# Patient Record
Sex: Male | Born: 1945 | Race: White | Hispanic: No | Marital: Married | Smoking: Current every day smoker
Health system: Southern US, Community
[De-identification: ages and names within clinical notes are randomized; demographics above are authoritative.]

## PROBLEM LIST (undated history)

## (undated) DIAGNOSIS — M1990 Unspecified osteoarthritis, unspecified site: Secondary | ICD-10-CM

## (undated) DIAGNOSIS — I10 Essential (primary) hypertension: Secondary | ICD-10-CM

## (undated) DIAGNOSIS — I2510 Atherosclerotic heart disease of native coronary artery without angina pectoris: Secondary | ICD-10-CM

## (undated) DIAGNOSIS — IMO0002 Reserved for concepts with insufficient information to code with codable children: Secondary | ICD-10-CM

## (undated) DIAGNOSIS — N189 Chronic kidney disease, unspecified: Secondary | ICD-10-CM

## (undated) DIAGNOSIS — G4733 Obstructive sleep apnea (adult) (pediatric): Secondary | ICD-10-CM

## (undated) DIAGNOSIS — F329 Major depressive disorder, single episode, unspecified: Secondary | ICD-10-CM

## (undated) DIAGNOSIS — F32A Depression, unspecified: Secondary | ICD-10-CM

## (undated) DIAGNOSIS — Z720 Tobacco use: Secondary | ICD-10-CM

## (undated) DIAGNOSIS — E669 Obesity, unspecified: Secondary | ICD-10-CM

## (undated) HISTORY — PX: BACK SURGERY: SHX140

## (undated) HISTORY — PX: CORONARY ARTERY BYPASS GRAFT: SHX141

---

## 1998-05-27 ENCOUNTER — Inpatient Hospital Stay (HOSPITAL_COMMUNITY): Admission: EM | Admit: 1998-05-27 | Discharge: 1998-05-28 | Payer: Self-pay | Admitting: *Deleted

## 2000-10-29 ENCOUNTER — Ambulatory Visit: Admission: RE | Admit: 2000-10-29 | Discharge: 2000-10-29 | Payer: Self-pay | Admitting: Internal Medicine

## 2000-10-30 ENCOUNTER — Inpatient Hospital Stay (HOSPITAL_COMMUNITY): Admission: EM | Admit: 2000-10-30 | Discharge: 2000-11-01 | Payer: Self-pay | Admitting: Internal Medicine

## 2000-10-30 ENCOUNTER — Encounter: Payer: Self-pay | Admitting: Emergency Medicine

## 2001-08-31 ENCOUNTER — Emergency Department (HOSPITAL_COMMUNITY): Admission: EM | Admit: 2001-08-31 | Discharge: 2001-08-31 | Payer: Self-pay | Admitting: Emergency Medicine

## 2003-04-23 ENCOUNTER — Ambulatory Visit (HOSPITAL_COMMUNITY): Admission: RE | Admit: 2003-04-23 | Discharge: 2003-04-23 | Payer: Self-pay | Admitting: General Surgery

## 2004-03-07 ENCOUNTER — Ambulatory Visit: Payer: Self-pay | Admitting: Cardiology

## 2004-03-08 ENCOUNTER — Ambulatory Visit: Payer: Self-pay | Admitting: Cardiology

## 2005-01-03 ENCOUNTER — Encounter: Admission: RE | Admit: 2005-01-03 | Discharge: 2005-01-03 | Payer: Self-pay | Admitting: Neurosurgery

## 2005-02-07 ENCOUNTER — Encounter: Admission: RE | Admit: 2005-02-07 | Discharge: 2005-02-07 | Payer: Self-pay | Admitting: Neurosurgery

## 2005-03-26 ENCOUNTER — Encounter: Admission: RE | Admit: 2005-03-26 | Discharge: 2005-03-26 | Payer: Self-pay | Admitting: Neurosurgery

## 2005-04-06 ENCOUNTER — Ambulatory Visit: Payer: Self-pay | Admitting: Cardiology

## 2005-04-12 ENCOUNTER — Ambulatory Visit (HOSPITAL_COMMUNITY): Admission: RE | Admit: 2005-04-12 | Discharge: 2005-04-13 | Payer: Self-pay | Admitting: Neurosurgery

## 2006-06-10 ENCOUNTER — Ambulatory Visit: Payer: Self-pay | Admitting: Cardiology

## 2006-07-08 ENCOUNTER — Ambulatory Visit (HOSPITAL_COMMUNITY): Admission: RE | Admit: 2006-07-08 | Discharge: 2006-07-08 | Payer: Self-pay | Admitting: Internal Medicine

## 2007-01-20 ENCOUNTER — Inpatient Hospital Stay (HOSPITAL_COMMUNITY): Admission: RE | Admit: 2007-01-20 | Discharge: 2007-01-21 | Payer: Self-pay | Admitting: Neurosurgery

## 2007-05-22 ENCOUNTER — Ambulatory Visit: Payer: Self-pay | Admitting: Cardiology

## 2007-06-10 ENCOUNTER — Ambulatory Visit: Payer: Self-pay | Admitting: Cardiology

## 2007-06-19 ENCOUNTER — Ambulatory Visit: Payer: Self-pay | Admitting: Cardiology

## 2007-06-23 ENCOUNTER — Inpatient Hospital Stay (HOSPITAL_BASED_OUTPATIENT_CLINIC_OR_DEPARTMENT_OTHER): Admission: RE | Admit: 2007-06-23 | Discharge: 2007-06-23 | Payer: Self-pay | Admitting: Cardiology

## 2007-06-23 ENCOUNTER — Ambulatory Visit: Payer: Self-pay | Admitting: Cardiovascular Disease

## 2007-07-04 ENCOUNTER — Ambulatory Visit: Payer: Self-pay | Admitting: Cardiology

## 2007-08-05 ENCOUNTER — Emergency Department (HOSPITAL_COMMUNITY): Admission: EM | Admit: 2007-08-05 | Discharge: 2007-08-06 | Payer: Self-pay | Admitting: Emergency Medicine

## 2007-09-04 ENCOUNTER — Ambulatory Visit (HOSPITAL_COMMUNITY): Admission: RE | Admit: 2007-09-04 | Discharge: 2007-09-05 | Payer: Self-pay | Admitting: Neurosurgery

## 2008-12-02 ENCOUNTER — Encounter (INDEPENDENT_AMBULATORY_CARE_PROVIDER_SITE_OTHER): Payer: Self-pay | Admitting: *Deleted

## 2008-12-02 DIAGNOSIS — I25810 Atherosclerosis of coronary artery bypass graft(s) without angina pectoris: Secondary | ICD-10-CM

## 2008-12-02 DIAGNOSIS — E785 Hyperlipidemia, unspecified: Secondary | ICD-10-CM

## 2008-12-02 DIAGNOSIS — I10 Essential (primary) hypertension: Secondary | ICD-10-CM | POA: Insufficient documentation

## 2009-04-28 ENCOUNTER — Ambulatory Visit (HOSPITAL_COMMUNITY): Admission: RE | Admit: 2009-04-28 | Discharge: 2009-04-28 | Payer: Self-pay | Admitting: Internal Medicine

## 2009-09-02 ENCOUNTER — Ambulatory Visit (HOSPITAL_COMMUNITY): Admission: RE | Admit: 2009-09-02 | Discharge: 2009-09-02 | Payer: Self-pay | Admitting: Urology

## 2009-09-30 ENCOUNTER — Ambulatory Visit (HOSPITAL_COMMUNITY): Admission: RE | Admit: 2009-09-30 | Discharge: 2009-09-30 | Payer: Self-pay | Admitting: Urology

## 2009-10-27 ENCOUNTER — Ambulatory Visit (HOSPITAL_COMMUNITY): Admission: RE | Admit: 2009-10-27 | Discharge: 2009-10-27 | Payer: Self-pay | Admitting: General Surgery

## 2009-10-28 ENCOUNTER — Encounter (INDEPENDENT_AMBULATORY_CARE_PROVIDER_SITE_OTHER): Payer: Self-pay | Admitting: General Surgery

## 2009-10-28 ENCOUNTER — Inpatient Hospital Stay (HOSPITAL_COMMUNITY): Admission: RE | Admit: 2009-10-28 | Discharge: 2009-10-31 | Payer: Self-pay | Admitting: General Surgery

## 2009-11-03 ENCOUNTER — Encounter (INDEPENDENT_AMBULATORY_CARE_PROVIDER_SITE_OTHER): Payer: Self-pay | Admitting: *Deleted

## 2010-04-30 ENCOUNTER — Encounter: Payer: Self-pay | Admitting: Urology

## 2010-05-09 NOTE — Letter (Signed)
Summary: Unable to Reach, Consult Scheduled  [facility]  [address]   [place], [place] [address]   Phone: [phone]  Fax: [phone]    11/03/2009  [facility] [address] [place], [place]  [address] 1945-11-04   Dear Dean Estes,   We have been unable to reach you by phone.  Please contact our office with an updated phone number.  At the recommendation of DR [facility] we have been asked to schedule you a consult with DR Jena Gauss for RECTAL BLEEDING/CONSULT FOR COLONOSCOPY.   Please call our office at [phone].     Thank you,    Diana Eves  [facility] R. Roetta Sessions, M.D.    Jonette Eva, M.D. Lorenza Burton, FNP-BC    Tana Coast, PA-C Phone: [phone]    Fax: [phone]

## 2010-05-09 NOTE — Letter (Signed)
Summary: Referral, Unable to Schedule  [facility]  [address]   [place], [place] [address]   Phone: [phone]  Fax: [phone]    November 03, 2009                                   REAHMED INNISS                                         [address]                                         [place], [place]  [address]                                         Date Of Birth: 02/21/1946  Dear Dr.__:   Danae Chen you for your kind referral of the above patient. We have attempted to schedule the recommended procedure __ but have been unable to schedule because:  __ The patient was not available by phone and/or has not returned our calls.  __ The patient declined to schedule the procedure at this time.  We appreciate the referral and hope that we will have the opportunity to treat this patient in the future.    Sincerely,     Diana Eves  [facility] R. Roetta Sessions, M.D.    Jonette Eva, M.D. Lorenza Burton, FNP-BC    Tana Coast, PA-C Phone: [phone]    Fax: [phone]

## 2010-06-24 LAB — CROSSMATCH: Antibody Screen: NEGATIVE

## 2010-06-24 LAB — CBC
Hemoglobin: 12.8 g/dL — ABNORMAL LOW (ref 13.0–17.0)
MCH: 33.6 pg (ref 26.0–34.0)
MCH: 33.7 pg (ref 26.0–34.0)
MCHC: 34.3 g/dL (ref 30.0–36.0)
MCHC: 34.3 g/dL (ref 30.0–36.0)
Platelets: 226 10*3/uL (ref 150–400)
Platelets: 236 10*3/uL (ref 150–400)
RBC: 3.81 MIL/uL — ABNORMAL LOW (ref 4.22–5.81)
RDW: 13.5 % (ref 11.5–15.5)
WBC: 11.9 10*3/uL — ABNORMAL HIGH (ref 4.0–10.5)
WBC: 9.3 10*3/uL (ref 4.0–10.5)

## 2010-06-24 LAB — BASIC METABOLIC PANEL
BUN: 25 mg/dL — ABNORMAL HIGH (ref 6–23)
CO2: 25 mEq/L (ref 19–32)
Calcium: 10.1 mg/dL (ref 8.4–10.5)
Calcium: 8.8 mg/dL (ref 8.4–10.5)
Chloride: 106 mEq/L (ref 96–112)
Chloride: 106 mEq/L (ref 96–112)
Creatinine, Ser: 1.85 mg/dL — ABNORMAL HIGH (ref 0.4–1.5)
GFR calc non Af Amer: 32 mL/min — ABNORMAL LOW (ref >60)
Potassium: 3.7 mEq/L (ref 3.5–5.1)

## 2010-06-24 LAB — DIFFERENTIAL
Basophils Absolute: 0 10*3/uL (ref 0.0–0.1)
Eosinophils Relative: 5 % (ref 0–5)
Lymphocytes Relative: 18 % (ref 12–46)
Monocytes Absolute: 0.6 10*3/uL (ref 0.1–1.0)
Monocytes Relative: 7 % (ref 3–12)
Neutro Abs: 6.5 10*3/uL (ref 1.7–7.7)
Neutrophils Relative %: 70 % (ref 43–77)

## 2010-06-24 LAB — MAGNESIUM: Magnesium: 1.8 mg/dL (ref 1.5–2.5)

## 2010-06-25 LAB — BASIC METABOLIC PANEL
Calcium: 9.9 mg/dL (ref 8.4–10.5)
GFR calc Af Amer: 51 mL/min — ABNORMAL LOW (ref >60)
Glucose, Bld: 110 mg/dL — ABNORMAL HIGH (ref 70–99)
Potassium: 4 mEq/L (ref 3.5–5.1)

## 2010-06-25 LAB — CBC
HCT: 46.6 % (ref 39.0–52.0)
MCV: 97.9 fL (ref 78.0–100.0)

## 2010-08-22 NOTE — Assessment & Plan Note (Signed)
[facility]                          Dean Estes   NAME:Dean Estes, Dean Estes                   MRN:          [id number]  DATE:05/22/2007                            DOB:          10-Nov-1945    REASON FOR VISIT:  Scheduled annual followup.   Dean Estes returns to our clinic for continued management of  multivessel coronary artery disease, after last seen by Dean Estes in April  2008.  He is closely followed by Dr. Carylon Perches, in [place].  Since  his last visit, he has been placed on low-dose amlodipine for  hypertension and Welchol for hyperlipidemia.  He has documented  intolerance to numerous statins, as well as Zetia and Tricor.  When I  last saw him I suggested Crestor, but he informs me today that he seems  to recall that this too resulted in significant adverse effects.  In  fact, he tells me that he developed a rash on Crestor.  Thus far, he  appears to be tolerating the Welchol, but is not able to take the full  dose of 6 tablets.   From a clinical standpoint, the patient reports no interim development  of unstable angina pectoris.  In fact, he apparently underwent lower  back surgery in October 2008, at [facility], with no noted  complications.  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  He is currently awaiting clearance by [facility] to do so.   The patient did take my prescription for Chantix at the time of his last  visit.  He tells me that he did stop smoking for about 6 months, but has  since resumed, albeit at the low level of one pack a week.   CURRENT MEDICATIONS:  1. Cozaar 50 daily.  2. Prilosec.  3. Coated aspirin 81 daily.  4. Fish oil 3000 mg daily.  5. Welchol 625 mg daily.  6. Fluoxetine 20 mg daily.  7. Glucosamine.  8. Amlodipine 5 mg daily.  9. Metoprolol 25 mg b.i.d.  10.Lyrica 75 mg daily.  11.CPAP.   PHYSICAL EXAMINATION:  VITAL SIGNS:  Blood pressure 150/88, pulse 60  and  regular, weight 259 (up 7 pounds).  GENERAL:  A 65 year old male, morbidly obese, sitting upright in no  distress.  HEENT:  Normocephalic, atraumatic.  NECK:  Palpable bilateral pulses without bruits.  Unable to assess JVD  secondary to neck girth.  LUNGS:  Clear to auscultation in all fields.  HEART:  Regular rate and rhythm (S1, S2).  No significant murmurs.  No  rubs.  ABDOMEN:  Protuberant, nontender with intact bowel sounds.  EXTREMITIES:  Palpable distal pulses with no significant pedal edema.  NEUROLOGIC:  No focal deficits.   IMPRESSION:  1. Multivessel coronary artery disease.      a.     Status post coronary artery bypass graft in 1998:  patent       bypass grafts/normal left ventricular function by cardiac       catheterization in 2000.      b.     Nonischemic adequate exercise  stress Cardiolite; ejection       fraction 62%, November 2005.  2. Dyslipidemia.      a.     Intolerant to all statins, as well as Zetia and Tricor.  3. Hypertension.  4. Obstructive sleep apnea, on CPAP.  5. Tobacco.  6. Morbid obesity.   PLAN:  1. Schedule adenosine stress Cardiolite for risk stratification.      According to [facility] guidelines, the patient is due for a surveillance      study given that he is now 1/2 years out from his last study.  2. Continue current medication regimen, as per Dr. Ouida Sills.  Close      monitoring of lipid profile with target LDL goal of 70 or less, if      feasible.  3. The patient once again strongly advised to stop smoking tobacco.  4. 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  If      his stress test is suggestive of ischemia, then we will have him      return to the clinic for early followup and consideration of      possible cardiac catheterization.  If, however, his study is      negative, then he can return to our clinic to resume followup with      Dr. Andee Lineman in 1 year.      Rozell Searing, PA-C  Electronically  Signed      Jonelle Sidle, MD  Electronically Signed   GS/MedQ  DD: 05/22/2007  DT: 05/24/2007  Job #: [id number]   cc:   Kingsley Callander. Ouida Sills, MD

## 2010-08-22 NOTE — Op Note (Signed)
NAME:  Dean Estes, Dean Estes NO.:  [id number]   MEDICAL RECORD NO.:  [id number]          PATIENT TYPE:  INP   LOCATION:  3524                         FACILITY:  [facility]   PHYSICIAN:  Clydene Fake, M.D.  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   PROCEDURE:  Anterior cervical decompression and diskectomy and fusion C3-  C4 with LifeNet allograft bone and Trestle anterior cervical plate.   SURGEON:  Clydene Fake, MD.   ASSISTANT:  Hewitt Shorts, MD.   General endotracheal tube anesthesia.   ESTIMATED BLOOD LOSS:  Minimal.   BLOOD GIVEN:  None.   DRAINS:  None.   COMPLICATIONS:  None.   REASON FOR PROCEDURE:  The patient is a 65 year old gentleman who has  had neck pain, arm and leg tingling and clumsiness that has been  progressive in MRI.  Cervical spine shows cord compression with cord  change seen at C3-C4 level.  The patient was brought in for an anterior  cervical decompression and fusion.  The patient is myelopathic on exam.   PROCEDURE IN DETAIL:  The patient was brought to the operating room and  general anesthesia was induced after a GlideScope intubation and being  careful not to extend his neck.  The patient was then prepped and draped  in a sterile fashion, placed in 10 pounds halter traction.  Site of  incision was injected with 10 mL of 1% Lidocaine with epinephrine.  An  incision was then made from the midline to the anterior border of the  sternocleidomastoid muscle.  On the left side, neck incision taken down  to the platysma and hemostasis obtained with Bovie cauterization.  The  platysma was incised with the Bovie and blunt dissection taken  through  the anterior cervical fascia to the anterior cervical spine.  Needle was  placed in the interspace.  X-rays were obtained showing this at the C3-  C4 interspace.  Disk space was incised and partial diskectomy performed  as the needle was removed.  Longus coli muscle was reflected laterally  on each side using the Bovie, and self-retaining retractor was placed.  Diskectomy continued with pituitary rongeurs and curettes.  Anterior  osteophytes removed with Kerrison punches.  Distraction pins were placed  in the C3 and C4.  The interspace was distracted.  Microscope was  brought in for microdissection.  Diskectomy continued with pituitary  rongeurs and curettes and 1 and 2-mm Kerrison punches were used to  remove posterior disk osteophytes and ligament decompressing the central  canal and bilateral foraminotomies were performed.  After a good  decompression, high-speed drill was used to remove cartilaginous end  plates.  We measured height of disk space to be 6 mm, and a 6-mm LifeNet  allograft bone was tapped into place, countersunk couple of millimeters.  We checked posterior to the graft  with a nerve hook and there was plenty  of room between the bone graft and dura.  Distraction pins were removed.  Hemostasis obtained with Gelfoam and thrombin.  Weight was removed from  the traction.  The bone was firmly in place.  A Trestle anterior  cervical plate was placed over the anterior cervical spine and 2 screws  were placed in the C3 and C4.  These were tightened down.  Lateral x-  rays were obtained showing good position of the plate and screws and  bone plug at the C3-C4 level.  The retractor was removed.  Hemostasis  was obtained with bipolar cauterization.  We irrigated with antibiotic  solution.  We had good hemostasis and the platysma was closed with 3-0  Vicryl interrupted sutures.  Subcutaneous tissue closed with same.  Skin  closed with benzoin and Steri-Strips.  Dressing was  placed.  The patient  was placed into a soft cervical collar, awakened from anesthesia, and  transferred to recovery room in stable condition.           ______________________________  Clydene Fake, M.D.     JRH/MEDQ  D:  09/04/2007  T:  09/05/2007  Job:  [id number]

## 2010-08-22 NOTE — Assessment & Plan Note (Signed)
[facility]                          [facility] OFFICE NOTE   NAME:Dean Estes, Dean Estes                   MRN:          [id number]  DATE:06/19/2007                            DOB:          07-Dec-1945    REFERRING PHYSICIAN:  Kingsley Callander. Ouida Sills, MD.   CARDIOLOGIST:  Jonelle Sidle, MD   HISTORY OF PRESENT ILLNESS:  The patient is a 65 year old male with a  history of multivessel coronary artery disease last seen on May 21, 2006.  The patient is status post coronary artery bypass graft September  1998 and a patent bypass graft and catheterization in 2000.  He had a  nonischemic Cardiolite in November of 2005.  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  He did  have a stress test scheduled which demonstrated however ischemia.  This  was an adenosine infusion protocol and the ejection fraction was 49%.  The patient has evidence of mild global hypokinesis with a mid and  complete reversal mid to baseline interseptal defect associated with  mild hypokinesis.  There was also a small fixed basolateral defect  associated with mild hypokinesis.  The latter defect appeared to be  consistent with a prior myocardial infarction.  The patient now returns  for followup and to discuss his study results.  His EKG in the office  today demonstrates normal sinus rhythm with no acute ischemic changes.  He denies any chest pain although he states that he does get short of  breath upon exertion.  He has had difficulty walking and difficulty  getting back to an exercise program due to prior back surgery with  weakness in the lower extremities.  He denies however any substernal  chest pain.  He has gained a significant amount of weight although he  has lost 10 pounds in the last 2 months.  Of note is that the patient is  intolerant to multiple statins which has been associated with myopathy  and muscle burning.  Also  Welchol has caused him to have painful  nipples.  The patient currently only takes aspirin with phytosterol.   MEDICATIONS:  1. Omega 3 fish oil.  2. Omeprazole 20 mg p.o. daily.  3. Welchol was discontinued.  4. Garlic 1000 mg p.o. daily.  5. Aspirin 81 mg a day.  6. Cozaar 50 mg p.o. daily.  7. Metoprolol 50 mg one half tablet p.o. b.i.d.  8. Amlodipine 5 mg p.o. daily.  9. Multivitamin daily.  10.Fluoxetine 20 mg p.o. daily.   PHYSICAL EXAMINATION:  VITAL SIGNS:  Blood pressure is 170/94, heart  rate is 72, weight is 254 pounds.  NECK:  Exam normal.  No carotid bruits.  LUNGS:  Clear breath sounds bilaterally.  HEART:  Regular rate and rhythm.  Normal S1, S2.  No murmurs, rubs or  gallops.  ABDOMEN:  Soft, nontender, but distant.  EXTREMITIES:  No cyanosis, clubbing or edema.  NEUROLOGICAL:  The patient is alert and grossly nonfocal.   A 12-lead electrocardiogram normal sinus rhythm with nonspecific ST-T  wave changes.  DIAGNOSES:  1. Multivessel coronary artery disease.      a.     Status post coronary artery bypass graft in 1998 with patent       bypass grafts and normal LV function.  Catheterization in 2000.      b.     Nonischemic Cardiolite in November of 2005.      c.     Abnormal Cardiolite on June 09, 2007, with ejection fraction       49% and defects as outlined above.  2. Dyslipidemia.      a.     Intolerant to all statins as well as Zetia and Tricor and       now also intolerant to [employer].  3. Hypertension, poorly controlled.  4. Obstructive sleep apnea.  5. Tobacco use.  6. Morbid obesity.   PLAN:  1. The patient will proceed with diagnostic catheterization due to the      fact that he has significant abnormal Cardiolite study.  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      It could be that this is a false positive study but I told the      patient  that the best possible option is to proceed with      catheterization to further evaluate this.  2. The patient was advised about weight loss and to try to start an      exercise program.  3. The patient's blood pressure is poorly controlled and we will go      ahead and increase his Cozaar to 100 mg p.o. daily but he will need      the addition of hydrochlorothiazide 25 mg p.o. daily.     Learta Codding, MD,FACC  Electronically Signed    GED/MedQ  DD: 06/19/2007  DT: 06/20/2007  Job #: [id number]   cc:   Kingsley Callander. Ouida Sills, MD  Jonelle Sidle, MD

## 2010-08-22 NOTE — Op Note (Signed)
NAME:  Dean Estes, Dean Estes NO.:  [id number]   MEDICAL RECORD NO.:  [id number]          PATIENT TYPE:  INP   LOCATION:  3172                         FACILITY:  [facility]   PHYSICIAN:  Clydene Fake, M.D.  DATE OF BIRTH:  08-10-1945   DATE OF PROCEDURE:  01/20/2007  DATE OF DISCHARGE:                               OPERATIVE REPORT   PREOPERATIVE DIAGNOSIS:  Lumbar stenosis.   POSTOPERATIVE DIAGNOSIS:  Lumbar stenosis.   PROCEDURE:  Redo decompressive laminectomy, decompressing the L2, L3, L4  and L5 roots (four levels).   REASON FOR PROCEDURE:  The patient is a 65 year old gentleman who had X  Stop placement at L2-3 and L3-4 for stenosis almost 2 years ago.  He had  great relief for awhile and then fell at work.  He has been having  severe back and leg pain, burning of his feet.  Conservative management  and antiinflammatories have not helped.  Epidural injection only had  brief response.  The patient is brought for redo decompressive  laminectomy.   PROCEDURE IN DETAIL:  The patient was brought to the operating room.  Anesthesia was induced.  The patient was placed in the prone position on  the Wilson frame.  All pressure points were padded.  Patient was prepped  and draped in sterile fashion.  The site of the incision was injected  with 20 mL of 1% lidocaine with epinephrine.  An incision was then made  at the site of the previous scar and this was extended slightly both  caudally and cephalad.  The incision was taken down to fascia.  Hemostasis was taken with Bovie cauterization.  The fascia was incised  with a Bovie on both sides and subperiosteal dissection was done across  the lamina out to the facets.  The X Stop devices at L2-3 and L3-4 were  seen.  These were also dissected out and the lamina self-retaining  retractors were placed.  A marker was placed at the L4-5 interspace and  the L2-3 interspace.  Then an x-ray was obtained confirming our  positioning.   Decompressive laminectomy was then performed removing the  spinous process in the bottom half of 2, 3, 4 and the top half of 5.  Doing this loosened the X Stop devices and both of those were removed.  We continued with our decompressive laminectomy, decompressing the  lamina with the Leksell rongeurs and then used a high speed drill to  continue the laminectomy to then remove cortex.  Kerrison punches were  used to remove ligaments and the rest of the bone, decompressing the  central canal.  Bilateral gutters were decompressed with both high speed  drill and Kerrison punches. The __________  ligament at the lateral  gutters were removed with curets and Kerrison punches.  When we were  finished, we had good decompression of the central thecal sac and good  lateral decompression.  We explored each of the nerve roots in the L2,  L3, L4 and L5 roots bilaterally.  They were well decompressed.  We could  follow their course out the foramen with a dilator.  Hemostasis was  obtained with Gelfoam and thrombin and bipolar cauterization.  Gelfoam  was irrigated out.  We had good hemostasis.  Retractors were removed.  The fascia was closed with 0 Vicryl interrupted sutures, subcutaneous  tissue was closed with 0, 2-0 and 3-0 Vicryl interrupted suture, skin  closed with benzoin and Steri-Strips.  Dressing was placed.  Patient was  placed back into supine position, woken from anesthesia and transferred  to the recovery room in stable condition.           ______________________________  Clydene Fake, M.D.    JRH/MEDQ  D:  01/20/2007  T:  01/20/2007  Job:  [id number]

## 2010-08-22 NOTE — Cardiovascular Report (Signed)
NAME:  Dean Estes, Dean Estes NO.:  [id number]   MEDICAL RECORD NO.:  [id number]          PATIENT TYPE:  OIB   LOCATION:  1961                         FACILITY:  [facility]   PHYSICIAN:  Veverly Fells. Excell Seltzer, MD  DATE OF BIRTH:  25-Jan-1946   DATE OF PROCEDURE:  06/23/2007  DATE OF DISCHARGE:                            CARDIAC CATHETERIZATION   PROCEDURES:  1. Left heart catheterization.  2. Selective coronary angiography.  3. Left ventricular angiography.  4. Saphenous vein graft angiography.  5. Left internal mammary artery angiography.   INDICATIONS:  Dean Estes is a 65 year old gentleman with known  coronary artery disease.  He has had multivessel bypass surgery and as  part of his DOT physical he underwent an adenosine Myoview scan that was  abnormal, showing mid to basal anteroseptal ischemia.  He is completely  asymptomatic.  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   Risks and indications of the procedure were reviewed with the patient.  Informed consent was obtained.  The right groin was prepped, draped and  anesthetized with 1% lidocaine.  Using the modified Seldinger technique,  a 4-French sheath was placed in the right femoral artery.  Standard 6-  French Judkins catheters were used for native vessel angiography.  For  vein graft angiography an RCB catheter was used for the right coronary  bypass and a 3-DRC catheter was used for the OM bypass.  I had a very  difficult time engaging the left internal mammary artery as it had an  anterior origin from lower than normal part of the subclavian artery.  Multiple catheters were used.  Ultimately the best image was obtained  with a multipurpose catheter.  After coronary and vein graft  angiography, a 4-French angled pigtail catheter was used to perform left  ventriculography.  The patient tolerated the procedure well and had no  immediate  complications.   FINDINGS:  Aortic pressure 150/89 with mean of 115, left ventricular  pressure 151/30.   CORONARY ANGIOGRAPHY:  The left mainstem is diffusely diseased without  significant stenosis.  The plaque is nonobstructive.  The left mainstem  supplies a left circumflex branch, which is completely occluded in its  proximal portion.  The LAD is occluded at the ostium.   The right coronary artery is occluded in its proximal portion and  supplies a small conus branch.   SAPHENOUS VEIN GRAFT ANGIOGRAPHY:  There is a sequence graft to an acute  marginal branch of the right coronary artery as well as the distal right  coronary artery.  The saphenous vein graft is patent but is diffusely  diseased, especially in the proximal portion of the graft.  There are no  significant stenoses present.  The acute marginal branch is small but is  patent.  The distal right coronary artery the near the PDA but beyond  the graft insertion has an 80% stenosis.  The vessel was diffusely  diseased.  There is severe angulation between the graft to coronary  anastomosis and the lesion.   A saphenous vein graft sequenced to  the ramus intermedius and OM  branches of the left circumflex is widely patent throughout.  The OM  branch is patent and is a large vessel.  The ramus is patent and it is a  small vessel.   The left internal mammary to LAD is only marginally visualized but it is  clearly widely patent.  The diagonal and LAD branches are both widely  patent.   Left ventriculography demonstrates normal LV function with an LVEF of  60%.   ASSESSMENT:  1. Severe native three-vessel coronary artery disease.  2. Patent left internal mammary sequenced to left anterior descending      artery and first diagonal.  3. Is patent saphenous vein graft sequenced to ramus intermedius and      an obtuse marginal branch of the left circumflex.  4. Patent saphenous vein graft sequenced to acute marginal branch  and      posterior descending artery branch of the right coronary artery.   DISCUSSION:  Dean Estes has widely patent bypass grafts.  The only  lesion that appears focal and significant is in the PDA branch of the  right coronary artery.  However, it involves a severe angulation of  nearly 270 degrees and I do not think it is possible to deliver a stent  to that area.  It in a patient who is completely asymptomatic I would  clearly favor medical therapy.  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  I have discussed this case  with Dr. Andee Lineman, and I will set him up for an exercise treadmill test  and Dr. Andee Lineman will follow up with him in the office after his exercise  treadmill is complete.      Veverly Fells. Excell Seltzer, MD  Electronically Signed     MDC/MEDQ  D:  06/23/2007  T:  06/23/2007  Job:  [id number]   cc:   Learta Codding, MD,FACC

## 2010-08-25 NOTE — Assessment & Plan Note (Signed)
[facility]                          [facility]ICE NOTE   NAME:[name], Dean Estes                   MRN:          [id number]  DATE:06/10/2006                            DOB:          May 18, 1945    NOTE:  Change the primary cardiologist on the previous note (June 10, 2006) from Dr. Simona Huh to Dr. Michelle Piper Degent.   Change the follow-up to myself and Dr. Lewayne Bunting, not Dr. Simona Huh.     Gene Serpe, PA-C  Electronically Signed    GS/MedQ  DD: 06/10/2006  DT: 06/10/2006  Job #: [id number]

## 2010-08-25 NOTE — Op Note (Signed)
NAME:  Dean Estes, Dean Estes NO.:  [id number]   MEDICAL RECORD NO.:  [id number]          PATIENT TYPE:  AMB   LOCATION:  SDS                          FACILITY:  [facility]   PHYSICIAN:  Clydene Fake, M.D.  DATE OF BIRTH:  April 29, 1945   DATE OF PROCEDURE:  04/12/2005  DATE OF DISCHARGE:                                 OPERATIVE REPORT   PREOPERATIVE DIAGNOSIS:  Lumbar stenosis, spondylosis.   POSTOPERATIVE DIAGNOSIS:  Lumbar stenosis, spondylosis.   OPERATION PERFORMED:  L2-3 and L3-4 X-Stop placement, intraoperative  fluoroscopy.   SURGEON:  Clydene Fake, M.D.   ANESTHESIA:  General endotracheal.   ESTIMATED BLOOD LOSS:  Minimal.   BLOOD REPLACED:  None.   DRAINS:  None.   COMPLICATIONS:  None.   INDICATIONS FOR PROCEDURE:  The patient is a 65 year old gentleman who has  been having bilateral leg pain, worse when he walks. He can only go a short  distance before he has to stop and rest.  Epidural injections have helped  him in the past but only for a brief period of time ____________ helps his  symptoms.  MRI was done showing multiple spondylitic changes but severe  stenosis, L2-3 and 3-4, moderate stenosis at 4-5.  He is brought in for X-  Stop device at 2-3 and 3-4 to decrease his neurogenic claudication.   DESCRIPTION OF PROCEDURE:  The patient was brought to the operating room and  general anesthesia was induced.  The patient was placed in prone position on  the Wilson frame and all pressure points padded.  The patient was prepped  and draped in sterile fashion.  The site of incision was injected with 15 mL  of 1% lidocaine with epinephrine.  A needle was placed in the interspace.  Fluoroscopy was used and this showed that we were pointing at the L3 spinous  process.  An incision was made centered where the needle was.  Incision  taken down to the fascia.  Hemostasis obtained with Bovie cauterization.  The fascia was incised on each side of the spinous  processes of 2, 3, and 4  and subperiosteal dissection was done over the L2, 3, and 4 spinous process  and lamina to the facets.  We again confirmed our positioning at the 2-3, 3-  4 paraspinous levels with fluoroscopy and then at the 2-3 used two different  dilators to dilate the interspinous ligaments.  We used a final dilator and  we dilated up just past 12 mm.  A 12 mm X-Stop device was used and placed  into the space we made in the interspinous ligament anteriorly. We brought  this to the other side.  The locking wing was then placed and the screw  engaged.  We tightened down the screw and removed the applicators.  We  repeated this process at L3-4, dilating up to just past 12 mm, placed a 12  mm X-Stop device in the interspinous ligament, placed the locking wing and  tightened it down and then removed the applicators.  Final AP and lateral  fluoroscopic imaging showed good positioning of the X-Stop  device at the L2-  3 and 3-4 levels.  Hemostasis was obtained with Bovie cauterization. Wound  was irrigated with antibiotic solution. The fascia was closed with 0 Vicryl  interrupted suture.  The subcutaneous tissue closed with 0, 2-0 and 3-0  Vicryl,  interrupted suture and the skin closed with benzoin and Steri-Strips.  Dry  sterile dressing was applied.  The patient was placed back into supine  position, awakened from anesthesia and transferred to the recovery room in  stable condition.           ______________________________  Clydene Fake, M.D.     JRH/MEDQ  D:  04/12/2005  T:  04/13/2005  Job:  [id number]

## 2010-08-25 NOTE — H&P (Signed)
NAME:  Dean Estes, Dean Estes NO.:  [id number]   MEDICAL RECORD NO.:  [id number]                  PATIENT TYPE:   LOCATION:                                       FACILITY:   PHYSICIAN:  Dalia Heading, M.D.               DATE OF BIRTH:  1946-01-28   DATE OF ADMISSION:  DATE OF DISCHARGE:                                HISTORY & PHYSICAL   CHIEF COMPLAINT:  GERD.   HISTORY OF PRESENT ILLNESS:  The patient is a 65 year old white male who is  referred for evaluation and treatment of GERD.  He has been on Prilosec for  some time; and has never had an EGD  He does well on Prilosec.  He denies  any nausea, vomiting, or epigastric pain.  He does have GERD when off the  medication.   PAST MEDICAL HISTORY:  Includes hypertension.   PAST SURGICAL HISTORY:  Hand surgery, flexible sigmoidoscopy by Dr. Ouida Sills 2  years ago.   CURRENT MEDICATIONS:  Cozaar, Prilosec, metoprolol, Lotensin, Prozac, baby  aspirin.   ALLERGIES:  SULFA.   REVIEW OF SYSTEMS:  The patient does smoke a pack of cigarettes a day,  denies any alcohol use.  Denies any other cardiopulmonary difficulties or  bleeding disorders.   PHYSICAL EXAMINATION:  GENERAL:  On physical examination, the patient is a  well-developed well-nourished white male in no acute distress.  VITAL SIGNS:  He is afebrile and vital signs are stable.  LUNGS:  Clear to auscultation with equal breath sounds bilaterally.  HEART:  Heart examination reveals a regular rate and rhythm without S3, S4,  or murmurs.  ABDOMEN:  The abdomen is soft, nontender, nondistended.  No  hepatosplenomegaly or masses are noted.   IMPRESSION:  Gastroesophageal reflux disease.   PLAN:  The patient is scheduled for an EGD on April 23, 2003.  The risks  and benefits of the procedure including bleeding and perforation were fully  explained to the patient, who gave informed consent.     ___________________________________________                                Dalia Heading, M.D.   MAJ/MEDQ  D:  04/22/2003  T:  04/22/2003  Job:  [id number]   cc:   Kingsley Callander. Ouida Sills, M.D.  [address]  [place]  [place] [address]  Fax: [phone]

## 2010-08-25 NOTE — Assessment & Plan Note (Signed)
[facility]                          [facility] OFFICE NOTE   NAME:Dean Estes, Dean Estes                   MRN:          [id number]  DATE:06/10/2006                            DOB:          [birth year]    PRIMARY CARDIOLOGIST:  Dr. Lewayne Bunting.   REASON FOR VISIT:  Scheduled annual followup.   Dean Estes is a very pleasant 65 year old male with known coronary  artery disease, last seen here in the clinic by Arnette Felts, PA-C, in  February 2007, and previously seen by Dr. Simona Huh in December 2006.   Patient's cardiac history notable for 6 vessel CABG in July 1998 and  with most recent catheterization in 2000 reveling widely patent bypass  graft and normal left ventricular function.   Patient's last stress test was an exercise stress Cardiolite in November  2005, reveling no definite evidence of ischemia; calculated ejection  fraction 62%.   Patient reports no interim development of any signs of symptoms  suggestive of unstable angina pectoris; he denies any chest pain, and in  fact, states that he can climb a flight of stairs with no chest  discomfort. He also reports no significant exertional dyspnea.   OF NOTE PATIENT HAS A LONGSTANDING HISTORY OF INTOLERANCE TO CERTAIN  STATINS. ALTHOUGH HE HAD BEEN ON BOTH ZOCOR AND LIPITOR FOR ABOUT 3  YEARS, HE SUBSEQUENTLY DEVELOPED SIGNIFICANT LEG AND BACK MYALGIA WHICH  RESOLVED PROMPTLY ONLY A FEW DAYS AFTER STOPPING BOTH OF THESE MEDS.  MOST RECENTLY, HE WAS TRIED ON ZETIA AND WAS INTOLERANT TO THIS  MEDICATION IN A RELATIVELY SHORT PERIOD OF TIME. HE HAS ALSO BEEN TRIED  ON TRICOR, AGAIN WITH ASSOCIATED SIGNIFICANT SIDE EFFECTS.   Patient continues to smoke, although he states today that he is cutting  down. He has also been diagnosed with obstructive sleep apnea in 1998  and tells me today that he has been very compliant with his CPAP  treatment.   Patient does report recent apparent sinus  infection with associated  headache and a greenish-tinged sinus drainage. He has had this in the  past and was previously effectively treated with amoxicillin.   Electrocardiogram today reveals NSR at 75 BPM with normal axis and  chronic lateral ST changes.   CURRENT MEDICATIONS:  1. Cozaar 50 q.d.  2. Prilosec 20 q.d.  3. Metoprolol 50 b.i.d.  4. Prozac 20 q.d.  5. Aspirin 325 q.d.  6. Fish oil 3000 mg daily.  7. CPAP as directed.   REVIEW OF SYSTEMS:  As per HPI, remaining systems negative.   PHYSICAL EXAMINATION:  Blood pressure 183/103, weight 252.  GENERAL: 65 year old male obese, sitting upright in no distress.  HEENT: Normocephalic, atraumatic.  NECK: Palpable carotid pulse without bruits; unable to assess JVD  secondary to neck girth.  LUNGS: Clear to auscultation all fields.  HEART: Regular rate and rhythm (S1, S2) no significant murmurs, no rubs.  ABDOMEN: Protuberant, nontender.  EXTREMITIES: Palpable distal pulses without edema.  NEURO: No focal deficit.   IMPRESSION:  1. Coronary artery disease.      a.     Successful coronary  artery bypass grafting in 1998: Patent       bypass graft and normal left ventricular function by cardiac       catheterization in 2000.      b.     Non-ishemic adequate exercise stress Cardiolite November       2005.  2. Uncontrolled hypertension.  3. Hyperlipidemia.      a.     INTOLERANT TO MULTIPLE STATINS, AS WELL AS ZETA AND TRICOR.  4. Morbid obesity.  5. Obstructive sleep apnea/on CPAP.  6. Tobacco.   PLAN:  1. Patient is willing to consider Chantix for smoking cessation and I      have prescribed this for him.  2. Decrease aspirin to 81 mg q.d.  3. Prescribe Augmentin 875 b.i.d. (times 1 week) for empiric treatment      of possible sinusitis. Patient will follow up with Dr. Ouida Sills next      week for reassessment of this and further recommendations.  4. I discussed the importance of aggressive blood pressure control and       have instructed him to monitor his blood pressure at home and to      discuss these findings with Dr. Ouida Sills, again with whom he is      scheduled to follow up next week. I will defer to Dr. Ouida Sills      regarding further adjustment of his current antihypertensive      regimen.  5. We discussed at length the importance of aggressive lipid      management but have taken into account the patients apparent      SIGNIFICANT INTOLERANCE TO SEVERAL STATINS IN THE PAST, AS WELL AS      ZETIA AND TRICOR. I did, however, suggest discussing the      possibility of using Crestor with Dr. Ouida Sills and that this may be      better tolerated by him. He also is due for scheduled blood work      including lipid profile, and I will defer to Dr. Ouida Sills regarding      consideration of this lipid-lowering agent. The patient has,      however, been instructed regarding current guidelines recommending      a target LDL goal of 70 or below.  6. Schedule return follow up with myself and Dr. Lewayne Bunting in 1 year.      Of note, patient will be due for a 3 year surveillance stress test      at that time.      Rozell Searing, PA-C  Electronically Signed      Jonelle Sidle, MD  Electronically Signed   GS/MedQ  DD: 06/10/2006  DT: 06/10/2006  Job #: [id number]   cc:   Kingsley Callander. Ouida Sills, MD

## 2011-01-03 LAB — CBC
HCT: 47.1
MCHC: 35.9
MCV: 98.8
RBC: 4.77
WBC: 12.2 — ABNORMAL HIGH

## 2011-01-03 LAB — BASIC METABOLIC PANEL
CO2: 25
Chloride: 104
Creatinine, Ser: 1.14
GFR calc Af Amer: >60
Potassium: 3.7

## 2011-01-03 LAB — URINE MICROSCOPIC-ADD ON

## 2011-01-03 LAB — URINALYSIS, ROUTINE W REFLEX MICROSCOPIC
Glucose, UA: NEGATIVE
Hgb urine dipstick: NEGATIVE
Leukocytes, UA: NEGATIVE
Specific Gravity, Urine: 1.013
pH: 7

## 2011-01-03 LAB — PROTIME-INR: Prothrombin Time: 12.3

## 2011-01-18 LAB — BASIC METABOLIC PANEL
BUN: 16
Chloride: 105
GFR calc Af Amer: >60
GFR calc non Af Amer: 58 — ABNORMAL LOW
Potassium: 4.1
Sodium: 140

## 2011-01-18 LAB — CBC
HCT: 47.8
Hemoglobin: 16.5
MCV: 98.4
RBC: 4.85
WBC: 11.5 — ABNORMAL HIGH

## 2011-01-18 LAB — URINE MICROSCOPIC-ADD ON

## 2011-01-18 LAB — URINALYSIS, ROUTINE W REFLEX MICROSCOPIC
Glucose, UA: NEGATIVE
Leukocytes, UA: NEGATIVE
Nitrite: NEGATIVE
Specific Gravity, Urine: 1.011
pH: 7

## 2011-01-18 LAB — APTT: aPTT: 35

## 2011-10-07 IMAGING — CT CT ABD-PEL WO/W CM
2 of 11 series · 11 of 46 positions shown, 18 images · IV contrast (Omnipaque 300)
Comparison: None

CLINICAL DATA: Gross hematuria.

CT ABDOMEN AND PELVIS WITHOUT AND WITH CONTRAST
TECHNIQUE: Multidetector CT imaging of the abdomen and pelvis was
performed without contrast material in one or both body regions,
followed by contrast material(s) and further sections in one or
both body regions.
Contrast: 100 ml 1mnipaque-YHH

[Series 3: mpr coro pre contrast (id) · coronal · non-contrast · 0.77mm/px · 2 of 114 slices shown, 3 images]
[im 38/114  soft-tissue]
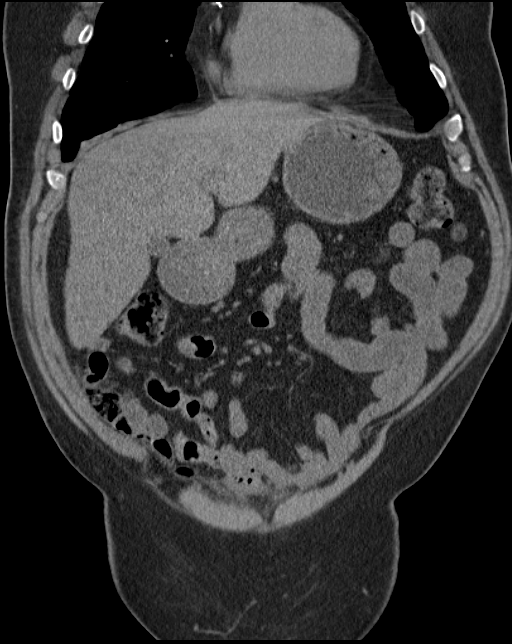
[im 38/114  bone]
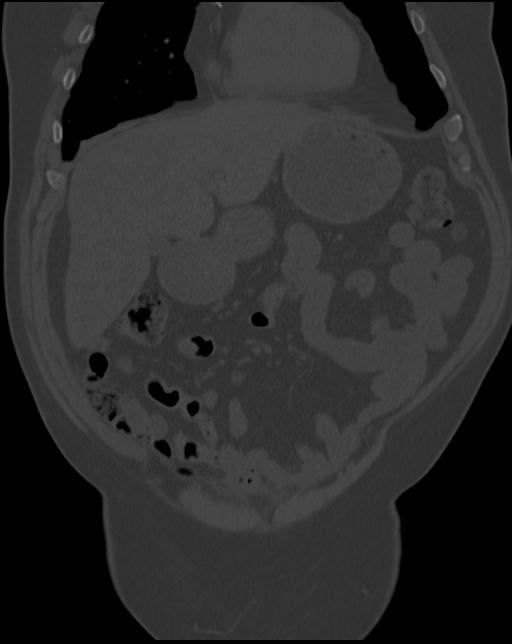
[im 76/114  soft-tissue]
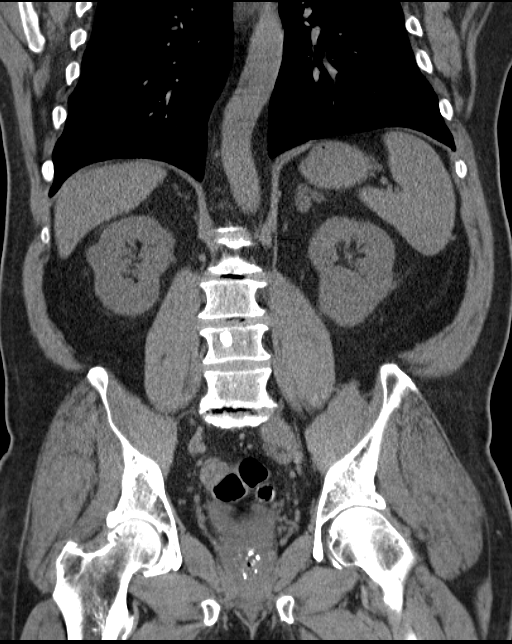

[Series 10: (person_name) thins post contrast (id) · axial · 0.83mm/px · z∈[-480,-84]mm · 9 of 331 slices shown, 15 images]
[im 34/331  soft-tissue]
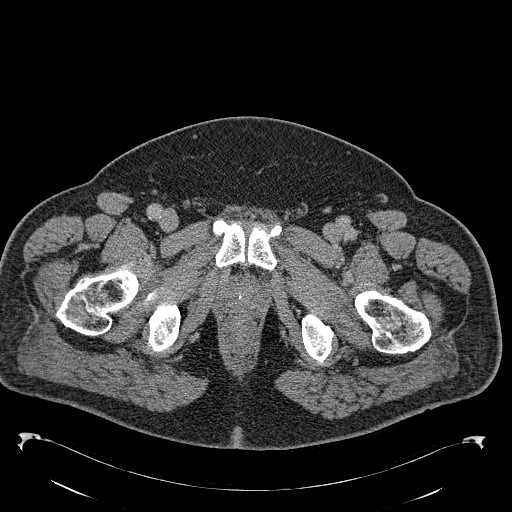
[im 34/331  bone]
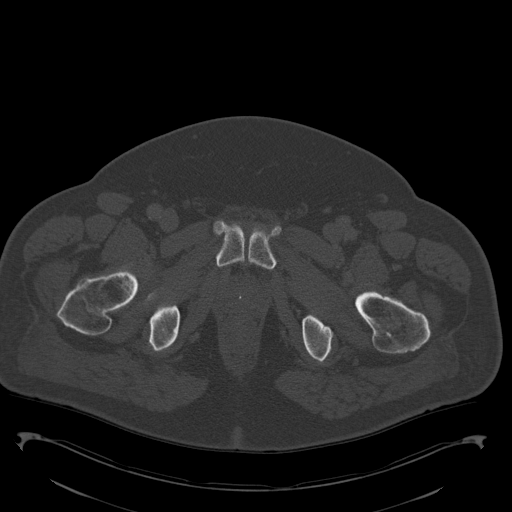
[im 67/331  soft-tissue]
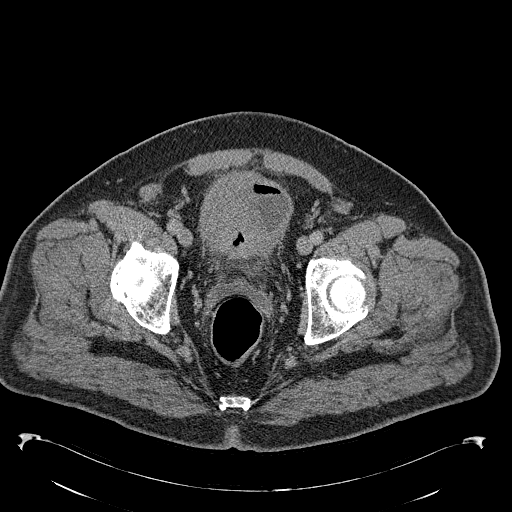
[im 100/331  soft-tissue]
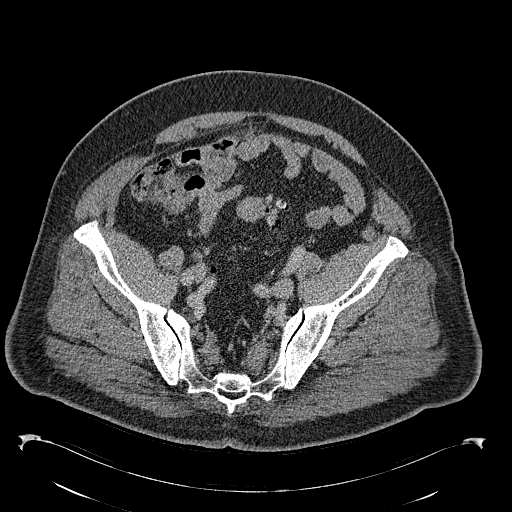
[im 133/331  soft-tissue]
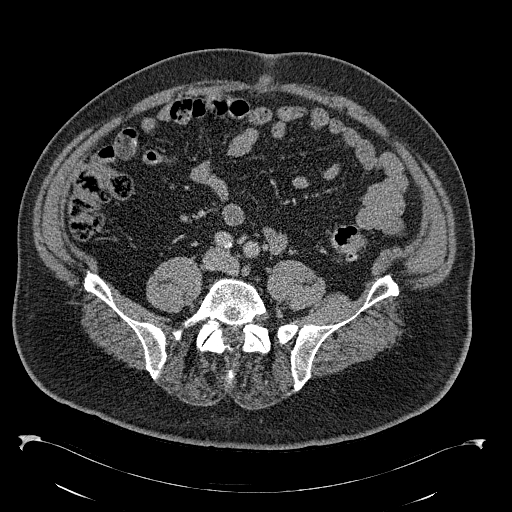
[im 166/331  soft-tissue]
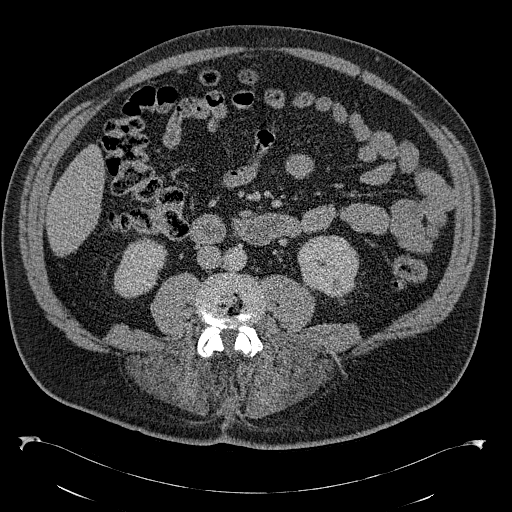
[im 199/331  soft-tissue]
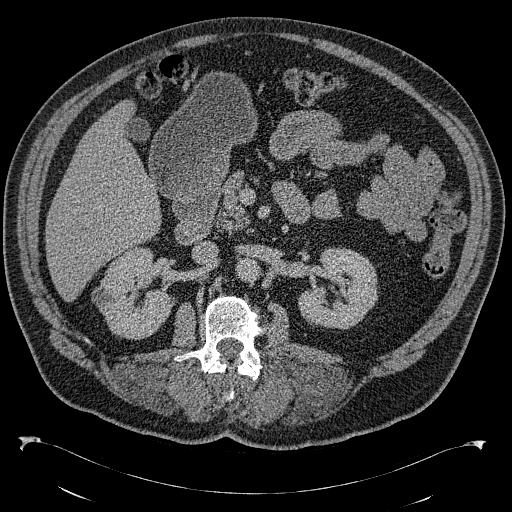
[im 199/331  lung]
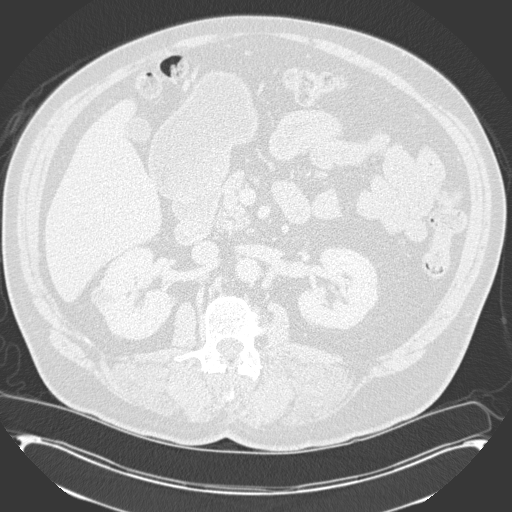
[im 232/331  soft-tissue]
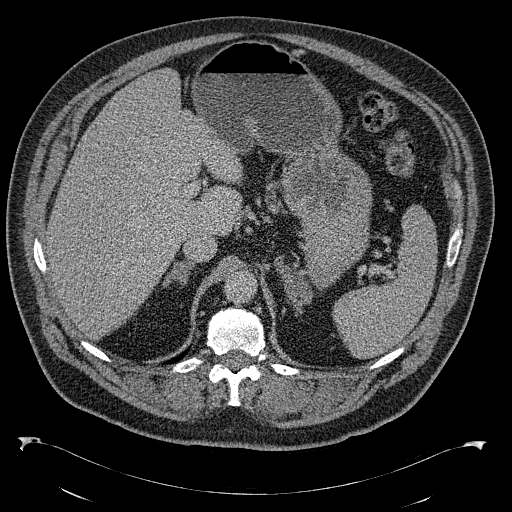
[im 232/331  lung]
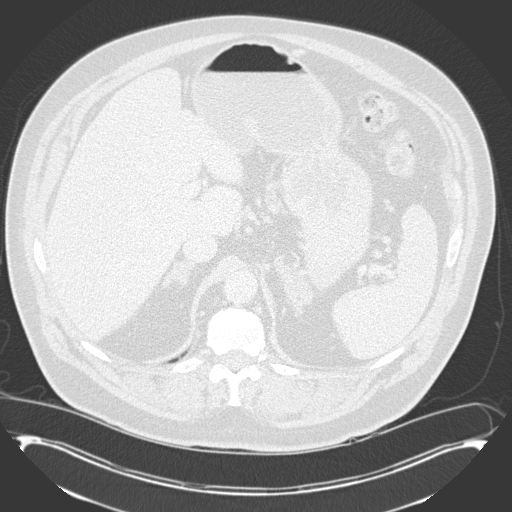
[im 265/331  soft-tissue]
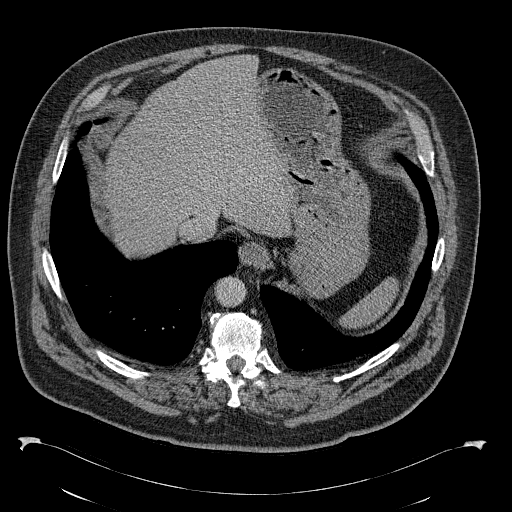
[im 265/331  lung]
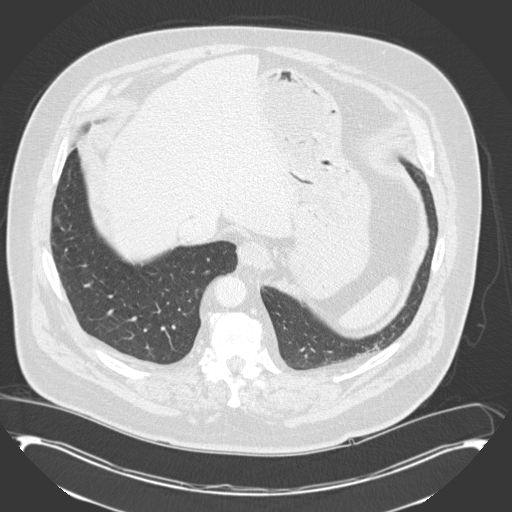
[im 298/331  soft-tissue]
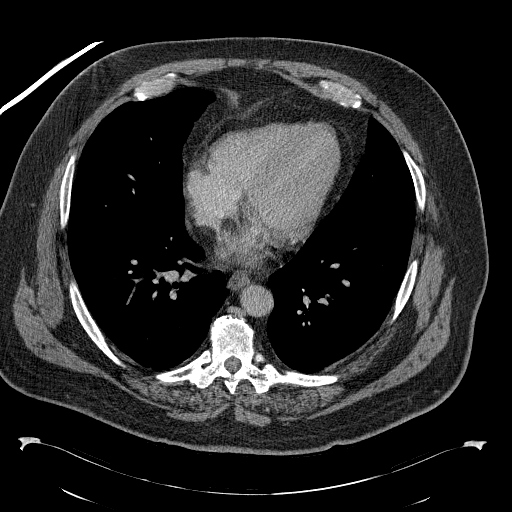
[im 298/331  lung]
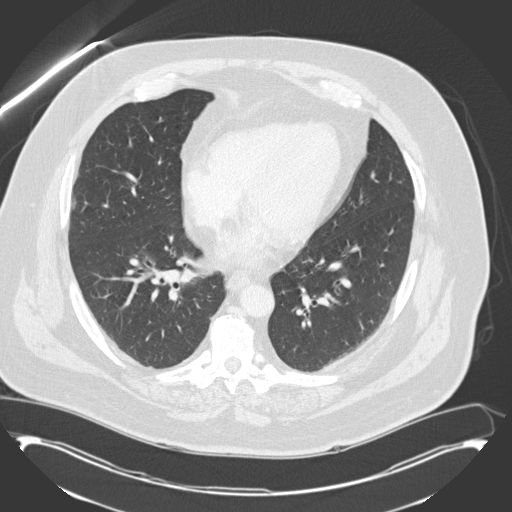
[im 298/331  bone]
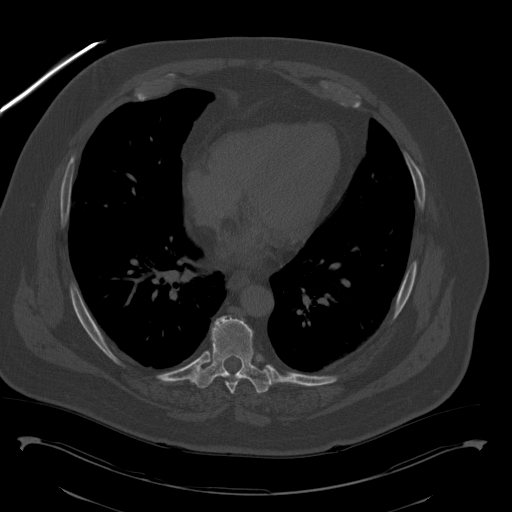

[11 of 46 positions shown; findings below may reference images not displayed]

FINDINGS: The lung bases are clear.  The heart is normal in size.
Coronary artery calcifications are noted.  No pericardial effusion.

The liver and spleen are unremarkable.  No biliary dilatation.  The
gallbladder is unremarkable except for one tiny gallstone.

The pancreas demonstrates no abnormalities.  There is a left
adrenal gland lesion which is consistent with a benign adenoma.
There are small bilateral renal cysts but no worrisome renal
lesions.

The stomach, duodenum and small bowel are grossly normal without
oral contrast.  There is diffuse diverticulosis of the sigmoid
colon and findings highly suspicious for a colovesical fistula.
There is an abnormal loop of bowel which is adjacent to the bladder
and there is air in the bladder.  The bladder appears markedly
thick-walled in the dome region.  A repeat CT scan of the pelvis
with rectal contrast may be helpful for further evaluation and
presurgical planning.  No discrete pelvic mass or adenopathy.
There is mild diffuse inflammatory change around the bladder.  The
prostate gland demonstrates calcifications but no obvious mass.
The rectum is unremarkable.  There are small obturator and inguinal
lymph nodes noted.

The aorta demonstrates atherosclerotic changes but no focal
aneurysm.

The delayed images through the kidneys demonstrate no collecting
system abnormalities.  The ureters are normal.  No bladder mass is
seen.  Two of the right renal cysts are hyperdense/hemorrhagic.

Degenerative changes throughout the spine but no destructive bony
lesions.
IMPRESSION: 1.  CT findings highly suspicious for a colovesicle fistula likely
from the sigmoid colon. A repeat pelvic CT scan with rectal
contrast only may be helpful for further evaluation and presurgical
planning.

2.  Small bilateral renal cysts but no worrisome renal lesions or
collecting system abnormality.  The ureters appear normal and no
bladder mass is identified.
3.  Diffuse diverticulosis of the sigmoid colon.

## 2013-03-21 ENCOUNTER — Encounter (HOSPITAL_COMMUNITY): Payer: Self-pay | Admitting: Internal Medicine

## 2013-03-21 ENCOUNTER — Inpatient Hospital Stay (HOSPITAL_COMMUNITY)
Admission: EM | Admit: 2013-03-21 | Discharge: 2013-03-24 | DRG: 281 | Disposition: A | Discharge status: Home / Self Care | Payer: Medicare Other | Source: Other Acute Inpatient Hospital | Attending: Internal Medicine | Admitting: Internal Medicine

## 2013-03-21 DIAGNOSIS — G8929 Other chronic pain: Secondary | ICD-10-CM | POA: Diagnosis present

## 2013-03-21 DIAGNOSIS — F3289 Other specified depressive episodes: Secondary | ICD-10-CM | POA: Diagnosis present

## 2013-03-21 DIAGNOSIS — I129 Hypertensive chronic kidney disease with stage 1 through stage 4 chronic kidney disease, or unspecified chronic kidney disease: Secondary | ICD-10-CM | POA: Diagnosis present

## 2013-03-21 DIAGNOSIS — Z8249 Family history of ischemic heart disease and other diseases of the circulatory system: Secondary | ICD-10-CM

## 2013-03-21 DIAGNOSIS — I214 Non-ST elevation (NSTEMI) myocardial infarction: Principal | ICD-10-CM

## 2013-03-21 DIAGNOSIS — Q618 Other cystic kidney diseases: Secondary | ICD-10-CM

## 2013-03-21 DIAGNOSIS — E669 Obesity, unspecified: Secondary | ICD-10-CM | POA: Diagnosis present

## 2013-03-21 DIAGNOSIS — E785 Hyperlipidemia, unspecified: Secondary | ICD-10-CM

## 2013-03-21 DIAGNOSIS — N179 Acute kidney failure, unspecified: Secondary | ICD-10-CM | POA: Diagnosis present

## 2013-03-21 DIAGNOSIS — Z951 Presence of aortocoronary bypass graft: Secondary | ICD-10-CM

## 2013-03-21 DIAGNOSIS — Z882 Allergy status to sulfonamides status: Secondary | ICD-10-CM

## 2013-03-21 DIAGNOSIS — Z79899 Other long term (current) drug therapy: Secondary | ICD-10-CM

## 2013-03-21 DIAGNOSIS — Z7982 Long term (current) use of aspirin: Secondary | ICD-10-CM

## 2013-03-21 DIAGNOSIS — M1990 Unspecified osteoarthritis, unspecified site: Secondary | ICD-10-CM | POA: Diagnosis present

## 2013-03-21 DIAGNOSIS — F329 Major depressive disorder, single episode, unspecified: Secondary | ICD-10-CM | POA: Diagnosis present

## 2013-03-21 DIAGNOSIS — I10 Essential (primary) hypertension: Secondary | ICD-10-CM

## 2013-03-21 DIAGNOSIS — I509 Heart failure, unspecified: Secondary | ICD-10-CM | POA: Diagnosis present

## 2013-03-21 DIAGNOSIS — N189 Chronic kidney disease, unspecified: Secondary | ICD-10-CM | POA: Diagnosis present

## 2013-03-21 DIAGNOSIS — F17200 Nicotine dependence, unspecified, uncomplicated: Secondary | ICD-10-CM | POA: Diagnosis present

## 2013-03-21 DIAGNOSIS — Z888 Allergy status to other drugs, medicaments and biological substances status: Secondary | ICD-10-CM

## 2013-03-21 DIAGNOSIS — I739 Peripheral vascular disease, unspecified: Secondary | ICD-10-CM | POA: Diagnosis present

## 2013-03-21 DIAGNOSIS — I2510 Atherosclerotic heart disease of native coronary artery without angina pectoris: Secondary | ICD-10-CM | POA: Diagnosis present

## 2013-03-21 DIAGNOSIS — G4733 Obstructive sleep apnea (adult) (pediatric): Secondary | ICD-10-CM | POA: Diagnosis present

## 2013-03-21 DIAGNOSIS — M549 Dorsalgia, unspecified: Secondary | ICD-10-CM | POA: Diagnosis present

## 2013-03-21 DIAGNOSIS — I25810 Atherosclerosis of coronary artery bypass graft(s) without angina pectoris: Secondary | ICD-10-CM

## 2013-03-21 HISTORY — DX: Obstructive sleep apnea (adult) (pediatric): G47.33

## 2013-03-21 HISTORY — DX: Essential (primary) hypertension: I10

## 2013-03-21 HISTORY — DX: Major depressive disorder, single episode, unspecified: F32.9

## 2013-03-21 HISTORY — DX: Chronic kidney disease, unspecified: N18.9

## 2013-03-21 HISTORY — DX: Atherosclerotic heart disease of native coronary artery without angina pectoris: I25.10

## 2013-03-21 HISTORY — DX: Depression, unspecified: F32.A

## 2013-03-21 HISTORY — DX: Reserved for concepts with insufficient information to code with codable children: IMO0002

## 2013-03-21 HISTORY — DX: Tobacco use: Z72.0

## 2013-03-21 HISTORY — DX: Obesity, unspecified: E66.9

## 2013-03-21 HISTORY — DX: Unspecified osteoarthritis, unspecified site: M19.90

## 2013-03-21 LAB — URINALYSIS, ROUTINE W REFLEX MICROSCOPIC
Bilirubin Urine: NEGATIVE
Nitrite: NEGATIVE
Protein, ur: 100 mg/dL — AB
Specific Gravity, Urine: 1.012 (ref 1.005–1.030)
Urobilinogen, UA: 0.2 mg/dL (ref 0.0–1.0)
pH: 6 (ref 5.0–8.0)

## 2013-03-21 LAB — URINE MICROSCOPIC-ADD ON

## 2013-03-21 LAB — MRSA PCR SCREENING: MRSA by PCR: NEGATIVE

## 2013-03-21 LAB — TROPONIN I
Troponin I: 6.12 ng/mL (ref <0.30)
Troponin I: 6.38 ng/mL (ref <0.30)

## 2013-03-21 LAB — GLUCOSE, CAPILLARY: Glucose-Capillary: 125 mg/dL — ABNORMAL HIGH (ref 70–99)

## 2013-03-21 MED ORDER — INFLUENZA VAC SPLIT QUAD 0.5 ML IM SUSP
0.5000 mL | INTRAMUSCULAR | Status: AC
  Administered 2013-03-22: 0.5 mL via INTRAMUSCULAR
  Filled 2013-03-21: qty 0.5

## 2013-03-21 MED ORDER — NITROGLYCERIN 0.4 MG SL SUBL: 0.4000 mg | SUBLINGUAL_TABLET | SUBLINGUAL | Status: DC | PRN

## 2013-03-21 MED ORDER — SIMVASTATIN 40 MG PO TABS: 40.0000 mg | ORAL_TABLET | Freq: Every day | ORAL | Status: DC

## 2013-03-21 MED ORDER — PANTOPRAZOLE SODIUM 40 MG PO TBEC
40.0000 mg | DELAYED_RELEASE_TABLET | Freq: Every day | ORAL | Status: DC
  Administered 2013-03-21 – 2013-03-24 (×4): 40 mg via ORAL
  Filled 2013-03-21 (×4): qty 1

## 2013-03-21 MED ORDER — ACETAMINOPHEN 325 MG PO TABS
650.0000 mg | ORAL_TABLET | ORAL | Status: DC | PRN
  Administered 2013-03-21 – 2013-03-23 (×3): 650 mg via ORAL
  Filled 2013-03-21 (×3): qty 2

## 2013-03-21 MED ORDER — ASPIRIN 81 MG PO CHEW
324.0000 mg | CHEWABLE_TABLET | ORAL | Status: AC
  Filled 2013-03-21: qty 4

## 2013-03-21 MED ORDER — ONDANSETRON HCL 4 MG/2ML IJ SOLN
4.0000 mg | Freq: Four times a day (QID) | INTRAMUSCULAR | Status: DC | PRN
Start: 2013-03-21 — End: 2013-03-24

## 2013-03-21 MED ORDER — HEPARIN (PORCINE) IN NACL 100-0.45 UNIT/ML-% IJ SOLN
1160.0000 [IU]/h | INTRAMUSCULAR | Status: DC
  Administered 2013-03-21: 1150 [IU]/h via INTRAVENOUS

## 2013-03-21 MED ORDER — SODIUM CHLORIDE 0.9 % IV SOLN
INTRAVENOUS | Status: DC
  Administered 2013-03-21: 75 mL/h via INTRAVENOUS
  Administered 2013-03-22: via INTRAVENOUS

## 2013-03-21 MED ORDER — HYDROCODONE-ACETAMINOPHEN 5-325 MG PO TABS
1.0000 | ORAL_TABLET | ORAL | Status: DC | PRN
  Administered 2013-03-24: 2 via ORAL
  Filled 2013-03-21: qty 2

## 2013-03-21 MED ORDER — METOPROLOL TARTRATE 25 MG PO TABS
25.0000 mg | ORAL_TABLET | Freq: Two times a day (BID) | ORAL | Status: DC
  Administered 2013-03-21 – 2013-03-24 (×7): 25 mg via ORAL
  Filled 2013-03-21 (×9): qty 1

## 2013-03-21 MED ORDER — CYCLOBENZAPRINE HCL 10 MG PO TABS: 10.0000 mg | ORAL_TABLET | Freq: Three times a day (TID) | ORAL | Status: DC | PRN

## 2013-03-21 MED ORDER — ASPIRIN 300 MG RE SUPP
300.0000 mg | RECTAL | Status: AC
  Filled 2013-03-21: qty 1

## 2013-03-21 MED ORDER — ALPRAZOLAM 0.5 MG PO TABS
1.0000 mg | ORAL_TABLET | Freq: Three times a day (TID) | ORAL | Status: DC | PRN
  Administered 2013-03-22 – 2013-03-23 (×2): 1 mg via ORAL
  Filled 2013-03-21 (×2): qty 2

## 2013-03-21 MED ORDER — HEPARIN (PORCINE) IN NACL 100-0.45 UNIT/ML-% IJ SOLN
1950.0000 [IU]/h | INTRAMUSCULAR | Status: DC
  Administered 2013-03-22: 1550 [IU]/h via INTRAVENOUS
  Administered 2013-03-23: 1750 [IU]/h via INTRAVENOUS
  Administered 2013-03-23 – 2013-03-24 (×2): 1950 [IU]/h via INTRAVENOUS
  Filled 2013-03-21 (×6): qty 250

## 2013-03-21 MED ORDER — HEPARIN BOLUS VIA INFUSION
3000.0000 [IU] | Freq: Once | INTRAVENOUS | Status: AC
  Administered 2013-03-21: 3000 [IU] via INTRAVENOUS
  Filled 2013-03-21: qty 3000

## 2013-03-21 MED ORDER — ASPIRIN EC 81 MG PO TBEC
81.0000 mg | DELAYED_RELEASE_TABLET | Freq: Every day | ORAL | Status: DC
  Administered 2013-03-22 – 2013-03-24 (×3): 81 mg via ORAL
  Filled 2013-03-21 (×3): qty 1

## 2013-03-21 MED ORDER — FLUOXETINE HCL 20 MG PO CAPS
20.0000 mg | ORAL_CAPSULE | Freq: Every day | ORAL | Status: DC
  Administered 2013-03-22 – 2013-03-24 (×3): 20 mg via ORAL
  Filled 2013-03-21 (×3): qty 1

## 2013-03-21 MED ORDER — ATORVASTATIN CALCIUM 20 MG PO TABS
20.0000 mg | ORAL_TABLET | Freq: Every day | ORAL | Status: DC
  Filled 2013-03-21 (×2): qty 1

## 2013-03-21 MED ORDER — AMLODIPINE BESYLATE 5 MG PO TABS
5.0000 mg | ORAL_TABLET | Freq: Every day | ORAL | Status: DC
  Administered 2013-03-21 – 2013-03-24 (×4): 5 mg via ORAL
  Filled 2013-03-21 (×4): qty 1

## 2013-03-21 NOTE — H&P (Signed)
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  He reports that he had cough syncope last evening.  He is unaware of details but says that his family says that he may have passed out.  He did well until early this morning when he woke with moderate chest discomfort with radiation into his shoulder blades and L arm.  He denies associated SOB, dizziness, palpitations, or other symptoms.  He reports having some exertional CP for 2 weeks. He presented to [facility] and was found to have ST depression with elevated troponin (3.46).  He also had a creatinine of 4.11.  He was placed on a heparin drip and has become chest  Pain free. Presently he is resting comfortably.  His primary concern is with his wife's well being while he is here.  Past Medical History  Diagnosis Date  . CAD (coronary artery disease)     s/p prior CABG  . Obesity   . Obstructive sleep apnea     uses CPAP  . Hypertension   . DDD (degenerative disc disease)   . DJD (degenerative joint disease)   . Chronic renal failure   . Depression   . Tobacco abuse    Past Surgical History  Procedure Laterality Date  . Back surgery      x 3  . Coronary artery bypass graft      17 years ago    Medicines- reviewed  Allergies  Allergen Reactions  . Sulfonamide Derivatives     History   Social History  . Marital Status: Married    Spouse Name: N/A    Number of Children: N/A  . Years of Education: N/A   Occupational History  . Not on file.   Social History Main Topics  . Smoking status: Current Every Day Smoker  . Smokeless tobacco: Not on file  . Alcohol Use: Yes     Comment: occasional  . Drug Use: No  . Sexual Activity: Not on file   Other Topics Concern  . Not on file   Social History Narrative   Lives in [place] with spouse who has parkinsons.  He is her primary care  giver          Family History  Problem Relation Age of Onset  . Hypertension      ROS- All systems are reviewed and negative except as per the HPI above  Physical Exam: Filed Vitals:   03/21/13 1000  Height: 5\' 8"  (1.727 m)  Weight: 248 lb 7.3 oz (112.7 kg)    GEN- The patient is obese, appearing, alert and oriented x 3 today.   Head- normocephalic, atraumatic Eyes-  Sclera clear, conjunctiva pink Ears- hearing intact Oropharynx- clear Neck- supple, no JVP Lymph- no cervical lymphadenopathy Lungs- Clear to ausculation bilaterally, normal work of breathing Heart- Regular rate and rhythm, no murmurs, rubs or gallops, PMI not laterally displaced GI- soft, NT, ND, + BS Extremities- no clubbing, cyanosis, or edema MS- no significant deformity or atrophy Skin- no rash or lesion Psych- euthymic mood, full affect Neuro- strength and sensation are intact  EKG from [facility] reveals sinus rhythm with RBBB, lateral ST depression is only slightly more prominent that baseline  More than 30 pages of records are reviewed from [place].  Assessment and Plan:  1. NSTEMI The patient presents with  chest pain and has elevated troponin concerning for NSTEMI.  He has had prior CABG.  He is admitted to stepdown for further management.  He is on ASA and heparin drip.  Add metoprolol, statin, and imdur as BP allow.  Hold ace inhibitor due to renal failure.   Consider cath if creatinine improves Obtain an echo to evaluate EF.  2. Syncope He likely had cough syncope.  Smoking cessation is advised Obtain an echo Follow on telemetry  3. HTN Above goal Titrate medicines  4. Renal failure Creatinine on transfer was 4.  I do not have a baseline for him.  We will gently hydrate and follow Obtain records from prior care when available. Will need more aggressive workup if this change is new from baseline Start with UA  5. OSA CPAP  6. Tobacco Cessation advised  7. HL Check fasting  lipids.

## 2013-03-21 NOTE — Progress Notes (Signed)
Pt placed on CPAP at 19 per home setting via nasal mask. Pt tolerating well at this time. RT will continue to monitor.

## 2013-03-21 NOTE — Progress Notes (Signed)
CSW spoke with pt and pt's brother at bedside about resources for pt's wife at home.  Pt reports family is with her currently, however family will not be available 24 hours daily after today.  CSW discussed if placement was a possibility and pt refused this for his wife.  CSW discussed consulting CM about home health agencies and possibly a Engineer, site.  PT agreed with this and is aware there will be a private fee.  CSW contacted CM and reported information learned from meeting with pt.  Please reconsult CSW if further needs arise.   [phone] (weekend CSW)

## 2013-03-21 NOTE — Progress Notes (Signed)
ANTICOAGULATION CONSULT NOTE - Initial Consult  Pharmacy Consult:  Heparin Indication: chest pain/ACS  Allergies  Allergen Reactions  . Sulfonamide Derivatives     Patient Measurements: Height: 5\' 8"  (172.7 cm) Weight: 248 lb 7.3 oz (112.7 kg) IBW/kg (Calculated) : 68.4 Heparin Dosing Weight: 94 kg  Vital Signs: BP: 151/94 mmHg (12/13 1100) Pulse Rate: 82 (12/13 1100)  Labs: No results found for this basename: HGB, HCT, PLT, APTT, LABPROT, INR, HEPARINUNFRC, CREATININE, CKTOTAL, CKMB, TROPONINI,  in the last 72 hours  Estimated Creatinine Clearance: 47.2 ml/min (by C-G formula based on Cr of 1.85).   Medical History: Past Medical History  Diagnosis Date  . CAD (coronary artery disease)     s/p prior CABG  . Obesity   . Obstructive sleep apnea     uses CPAP  . Hypertension   . DDD (degenerative disc disease)   . DJD (degenerative joint disease)   . Chronic renal failure   . Depression   . Tobacco abuse        Assessment: 3 YOM with chest pain transferred from [facility] to [facility] for further care.  Patient received heparin 5000 units IV bolus, then started on the infusion at 1160 units/hr since ~0700, prior to transfer.  Labs from [place] reviewed.   Goal of Therapy:  Heparin level 0.3-0.7 units/ml Monitor platelets by anticoagulation protocol: Yes    Plan:  - Continue heparin gtt at 1160 units/hr for now - Check HL at 1500 - Daily HL / CBC     Johm Pfannenstiel D. Laney Potash, PharmD, BCPS Pager:  [phone] 03/21/2013, 11:59 AM

## 2013-03-21 NOTE — Care Management Note (Unsigned)
    Page 1 of 1   03/21/2013     2:28:59 PM   CARE MANAGEMENT NOTE 03/21/2013  Patient:  Dean Estes, Dean Estes   Account Number:  [id number]  Date Initiated:  03/21/2013  Documentation initiated by:  GRAVES-BIGELOW,Sharnise Blough  Subjective/Objective Assessment:   Pt admitted with chest pain as transfer from [facility]. 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     Action/Plan:   CM did speak to pt and he states that family is with wife now. CM did provide pt with PCS list. Cm will continue to monitor.   Anticipated DC Date:  03/23/2013   Anticipated DC Plan:  HOME/SELF CARE         Choice offered to / List presented to:             Status of service:  In process, will continue to follow Medicare Important Message given?   (If response is "NO", the following Medicare IM given date fields will be blank) Date Medicare IM given:   Date Additional Medicare IM given:    Discharge Disposition:    Per UR Regulation:    If discussed at Long Length of Stay Meetings, dates discussed:    Comments:

## 2013-03-21 NOTE — Progress Notes (Signed)
ANTICOAGULATION CONSULT NOTE - Follow Up Consult  Pharmacy Consult for Heparin Indication: chest pain/ACS  Allergies  Allergen Reactions  . Codeine Other (See Comments)    REACTION:  "makes me crazy"  . Sulfonamide Derivatives Other (See Comments)    REACTION:  unknown  . Tape Rash    Paper tape OK.    Patient Measurements: Height: 5\' 8"  (172.7 cm) Weight: 248 lb 7.3 oz (112.7 kg) IBW/kg (Calculated) : 68.4 Heparin Dosing Weight: 94kg  Vital Signs: Temp: 97.7 F (36.5 C) (12/13 1005) Temp src: Oral (12/13 1005) BP: 148/99 mmHg (12/13 1500) Pulse Rate: 86 (12/13 1500)  Labs:  Recent Labs  03/21/13 1218 03/21/13 1440  HEPARINUNFRC  --  <0.10*  TROPONINI 6.12*  --     Estimated Creatinine Clearance: 47.2 ml/min (by C-G formula based on Cr of 1.85).   Medications:  Heparin @ 1150 units/hr  Assessment: 67yom started on IV heparin at [facility] for NSTEMI. Initial heparin level is undetectable. No issues with infusion. No plans for cath until renal function improves.  Goal of Therapy:  Heparin level 0.3-0.7 units/ml Monitor platelets by anticoagulation protocol: Yes   Plan:  1) Re-bolus heparin 3000 units x 1 2) Increase heparin to 1550 units/hr 3) Check heparin level in 6 hours  Fredrik Rigger 03/21/2013,4:00 PM

## 2013-03-22 ENCOUNTER — Inpatient Hospital Stay (HOSPITAL_COMMUNITY): Payer: Medicare Other

## 2013-03-22 DIAGNOSIS — N179 Acute kidney failure, unspecified: Secondary | ICD-10-CM

## 2013-03-22 DIAGNOSIS — I519 Heart disease, unspecified: Secondary | ICD-10-CM

## 2013-03-22 LAB — GLUCOSE, CAPILLARY
Glucose-Capillary: 132 mg/dL — ABNORMAL HIGH (ref 70–99)
Glucose-Capillary: 133 mg/dL — ABNORMAL HIGH (ref 70–99)
Glucose-Capillary: 129 mg/dL — ABNORMAL HIGH (ref 70–99)

## 2013-03-22 LAB — HEPARIN LEVEL (UNFRACTIONATED)
Heparin Unfractionated: 0.24 IU/mL — ABNORMAL LOW (ref 0.30–0.70)
Heparin Unfractionated: 0.42 IU/mL (ref 0.30–0.70)

## 2013-03-22 LAB — HEPATITIS PANEL, ACUTE
Hep B C IgM: NONREACTIVE
Hepatitis B Surface Ag: NEGATIVE

## 2013-03-22 LAB — MAGNESIUM: Magnesium: 1.8 mg/dL (ref 1.5–2.5)

## 2013-03-22 LAB — CBC
Hemoglobin: 13 g/dL (ref 13.0–17.0)
MCV: 99.5 fL (ref 78.0–100.0)
Platelets: 233 10*3/uL (ref 150–400)
RBC: 3.95 MIL/uL — ABNORMAL LOW (ref 4.22–5.81)
RDW: 13.6 % (ref 11.5–15.5)
WBC: 12.2 10*3/uL — ABNORMAL HIGH (ref 4.0–10.5)

## 2013-03-22 LAB — PROTEIN / CREATININE RATIO, URINE
Creatinine, Urine: 49.46 mg/dL
Total Protein, Urine: 120.7 mg/dL

## 2013-03-22 LAB — IRON AND TIBC
Iron: 91 ug/dL (ref 42–135)
TIBC: 219 ug/dL (ref 215–435)
UIBC: 128 ug/dL (ref 125–400)

## 2013-03-22 LAB — BASIC METABOLIC PANEL
CO2: 21 mEq/L (ref 19–32)
Calcium: 9.9 mg/dL (ref 8.4–10.5)
Chloride: 106 mEq/L (ref 96–112)
Creatinine, Ser: 3.87 mg/dL — ABNORMAL HIGH (ref 0.50–1.35)
GFR calc Af Amer: 17 mL/min — ABNORMAL LOW (ref >90)
Sodium: 138 mEq/L (ref 135–145)

## 2013-03-22 LAB — LIPID PANEL
HDL: 33 mg/dL — ABNORMAL LOW (ref >39)
Total CHOL/HDL Ratio: 7.8 RATIO
Triglycerides: 468 mg/dL — ABNORMAL HIGH (ref <150)

## 2013-03-22 LAB — CK: Total CK: 81 U/L (ref 7–232)

## 2013-03-22 LAB — TROPONIN I: Troponin I: 5.79 ng/mL (ref <0.30)

## 2013-03-22 LAB — PHOSPHORUS: Phosphorus: 3.1 mg/dL (ref 2.3–4.6)

## 2013-03-22 MED ORDER — PERFLUTREN LIPID MICROSPHERE
INTRAVENOUS | Status: AC
  Administered 2013-03-22: 2 mL
  Filled 2013-03-22: qty 10

## 2013-03-22 MED ORDER — GUAIFENESIN 100 MG/5ML PO SYRP
200.0000 mg | ORAL_SOLUTION | ORAL | Status: DC | PRN
  Administered 2013-03-22: 200 mg via ORAL
  Filled 2013-03-22: qty 10

## 2013-03-22 MED ORDER — SALINE SPRAY 0.65 % NA SOLN
1.0000 | NASAL | Status: DC | PRN
  Filled 2013-03-22: qty 44

## 2013-03-22 MED ORDER — ATORVASTATIN CALCIUM 40 MG PO TABS
40.0000 mg | ORAL_TABLET | Freq: Every day | ORAL | Status: DC
  Filled 2013-03-22 (×3): qty 1

## 2013-03-22 MED ORDER — SODIUM CHLORIDE 0.9 % IV SOLN
INTRAVENOUS | Status: DC
  Administered 2013-03-23 – 2013-03-24 (×3): via INTRAVENOUS

## 2013-03-22 MED ORDER — GEMFIBROZIL 600 MG PO TABS: 600.0000 mg | ORAL_TABLET | Freq: Two times a day (BID) | ORAL | Status: DC

## 2013-03-22 MED ORDER — PERFLUTREN LIPID MICROSPHERE: 1.0000 mL | INTRAVENOUS | Status: AC | PRN

## 2013-03-22 NOTE — Progress Notes (Signed)
ANTICOAGULATION CONSULT NOTE - Follow Up Consult  Pharmacy Consult:  Heparin Indication: chest pain/ACS  Allergies  Allergen Reactions  . Codeine Other (See Comments)    REACTION:  "makes me crazy"  . Sulfonamide Derivatives Other (See Comments)    REACTION:  unknown  . Tape Rash    Paper tape OK.    Patient Measurements: Height: 5\' 8"  (172.7 cm) Weight: 248 lb 14.4 oz (112.9 kg) IBW/kg (Calculated) : 68.4 Heparin Dosing Weight: 94 kg  Vital Signs: Temp: 98.2 F (36.8 C) (12/14 0730) Temp src: Oral (12/14 0730) BP: 154/89 mmHg (12/14 0800) Pulse Rate: 67 (12/14 0800)  Labs:  Recent Labs  03/21/13 1218 03/21/13 1440 03/21/13 1920 03/21/13 2345 03/22/13 0541 03/22/13 0916  HGB  --   --   --   --  13.0  --   HCT  --   --   --   --  39.3  --   PLT  --   --   --   --  233  --   HEPARINUNFRC  --  <0.10*  --  0.24*  --  0.42  CREATININE  --   --   --   --  3.87*  --   TROPONINI 6.12*  --  6.38* 5.79*  --   --     Estimated Creatinine Clearance: 22.6 ml/min (by C-G formula based on Cr of 3.87).     Assessment: 65 YOM with chest pain transferred from [facility] to [facility] for further care.  He continues on IV heparin and heparin level is therapeutic.  No bleeding reported.   Goal of Therapy:  Heparin level 0.3-0.7 units/ml Monitor platelets by anticoagulation protocol: Yes    Plan:  - Continue heparin gtt at 1750 units/hr - Daily HL / CBC - F/U ECHO   Saray Capasso D. Laney Potash, PharmD, BCPS Pager:  [phone] 03/22/2013, 10:42 AM

## 2013-03-22 NOTE — Consult Note (Addendum)
Renal Service Consult Note [facility]  ASHISH ROSSETTI 03/22/2013 Kennen Stammer D Requesting Physician:  Dr Johney Frame  Reason for Consult:  Renal failure, NSTEMI HPI: The patient is a 67 y.o. year-old with hx of CAD, CABG, obesity, OSA on CPAP, HTN, DJD, depression and tobacco use. Pt presented 12/12 to [facility] hospital report of  syncopal episode related to coughing, followed by CP radiating to L shoulder. He was admitted and found to have ST depression on EKG and ^trop of 3.46.  Creat was 4.11.  He was started on IV heparin and CP resolved. Now transferred to [facility]. Renal consult for Precious Bard.  Trop peaked at 6.38 and last one was 5.79.  UA showed 1.012, ph 6.0, protein 100, small Hb, tntc wbc and 0-2 rbc's, many bacteria. Creat here is 3.87. No old labs in system, pt has not been here before.    Pt says he has no hx of kidney disease, last PCP visit was about 2 yrs ago.  No dysuria, hematuria, difficulty voiding or hx of prostate disease. +nsaid use , variable over the last 6 years with ibuprofen up to 4 per day for chronic back pain.  One big bottle lasts about 6 months.   Home meds were norvasc, xanax, prozac, flexeril, cozaar, lopressor, prilosec, potassium gluconate, and trazodone.      ROS  no n/v/d  no loss of appetite'  no skin lesions  no hair loss  no sob or cough  Past Medical History  Past Medical History  Diagnosis Date  . CAD (coronary artery disease)     s/p prior CABG  . Obesity   . Obstructive sleep apnea     uses CPAP  . Hypertension   . DDD (degenerative disc disease)   . DJD (degenerative joint disease)   . Chronic renal failure   . Depression   . Tobacco abuse    Past Surgical History  Past Surgical History  Procedure Laterality Date  . Back surgery      x 3  . Coronary artery bypass graft      17 years ago   Family History  Family History  Problem Relation Age of Onset  . Hypertension     Social History  reports that he has  been smoking.  He does not have any smokeless tobacco history on file. He reports that he drinks alcohol. He reports that he does not use illicit drugs. Allergies  Allergies  Allergen Reactions  . Codeine Other (See Comments)    REACTION:  "makes me crazy"  . Sulfonamide Derivatives Other (See Comments)    REACTION:  unknown  . Tape Rash    Paper tape OK.   Home medications Prior to Admission medications   Medication Sig Start Date End Date Taking? Authorizing Provider  ALPRAZolam Prudy Feeler) 1 MG tablet Take 1 mg by mouth 2 (two) times daily as needed for anxiety.   Yes Historical Provider, MD  amLODipine (NORVASC) 5 MG tablet Take 5 mg by mouth daily.   Yes Historical Provider, MD  aspirin EC 81 MG tablet Take 81 mg by mouth daily.   Yes Historical Provider, MD  cyclobenzaprine (FLEXERIL) 10 MG tablet Take 10 mg by mouth 3 (three) times daily as needed for muscle spasms.   Yes Historical Provider, MD  FLUoxetine (PROZAC) 20 MG capsule Take 20 mg by mouth 2 (two) times daily.   Yes Historical Provider, MD  HYDROcodone-acetaminophen (NORCO/VICODIN) 5-325 MG per tablet Take 1 tablet by mouth  3 (three) times daily as needed for moderate pain.   Yes Historical Provider, MD  losartan (COZAAR) 100 MG tablet Take 100 mg by mouth daily.   Yes Historical Provider, MD  Melatonin 5 MG TABS Take 5 mg by mouth at bedtime as needed (for sleep).   Yes Historical Provider, MD  metoprolol (LOPRESSOR) 50 MG tablet Take 25 mg by mouth 2 (two) times daily.   Yes Historical Provider, MD  omeprazole (PRILOSEC) 20 MG capsule Take 20 mg by mouth daily.   Yes Historical Provider, MD  Potassium Gluconate 595 MG CAPS Take 595 mg by mouth daily.   Yes Historical Provider, MD  senna (SENOKOT) 8.6 MG tablet Take 1 tablet by mouth daily.   Yes Historical Provider, MD  traZODone (DESYREL) 50 MG tablet Take 50 mg by mouth at bedtime as needed for sleep.   Yes Historical Provider, MD   Liver Function Tests No results found  for this basename: AST, ALT, ALKPHOS, BILITOT, PROT, ALBUMIN,  in the last 168 hours No results found for this basename: LIPASE, AMYLASE,  in the last 168 hours CBC  Recent Labs Lab 03/22/13 0541  WBC 12.2*  HGB 13.0  HCT 39.3  MCV 99.5  PLT 233   Basic Metabolic Panel  Recent Labs Lab 03/22/13 0541  NA 138  K 4.4  CL 106  CO2 21  GLUCOSE 104*  BUN 41*  CREATININE 3.87*  CALCIUM 9.9    Exam  Blood pressure 154/89, pulse 67, temperature 98.2 F (36.8 C), temperature source Oral, resp. rate 18, height 5\' 8"  (1.727 m), weight 112.9 kg (248 lb 14.4 oz), SpO2 97.00%.  gen: obese older adult male in no distress  skin: no rash, cyanosis  heent: eomi, sclera anicteric, throat clear and moist  neck: no jvd, flat neck veins  chest: clear bilat, no rales or wheezes  cor: regular, distant HS, no M or rub heard, pedal pulses  intact bilat  abd: soft, very obese, nontender, no ascites, liver down 3 cm  ext: trace pretibial edema bilat, no joint effusion, no gangrene/ulcers, R calf scar from vein harvest  neuro: alert, ox3, nonfocal, no asterixis  UA >1.012, ph 6.0, protein 100, small Hb, tntc wbc and 0-2 rbc's, ++ bacteria.  Assessment/Plan: 1. Renal failure: new problem, no old labs, last pcp visit 36yrs ago per pt. Here w nstemi.  Needs renal workup, plan renal US to r/o hydro, UPC ratio, serologies and myeloma workup. +hx of tobacco and HTN 15-20 yrs, and PVD, so HTN and renovascular dz or at top of list. Also obstruction given age. Also NSAID's moderate use for 4-6 yrs.  UA not suggestive of GN. Cozaar on hold appropriately for now, other meds reviewed. Will follow. Hold nephrotoxins for now. 2. HTN / volume: agree with IVF's at 75 / hr for now, no gross vol excess or depletion. Get cxr. 3. NSTEMI: per cardiology 4. CAD hx CABG 1997 5. Back pain / hx back surgery 6. Obesity / OSA / CPAP at home  Will follow.   Vinson Moselle MD (pgr) [phone]    ([phone] 03/22/2013,  11:44 AM

## 2013-03-22 NOTE — Progress Notes (Addendum)
SUBJECTIVE: The patient is doing well today.  At this time, he denies chest pain, shortness of breath, or any new concerns.  Marland Kitchen amLODipine  5 mg Oral Daily  . aspirin  324 mg Oral NOW   Or  . aspirin  300 mg Rectal NOW  . aspirin EC  81 mg Oral Daily  . atorvastatin  20 mg Oral q1800  . FLUoxetine  20 mg Oral Daily  . influenza vac split quadrivalent PF  0.5 mL Intramuscular Tomorrow-1000  . metoprolol tartrate  25 mg Oral BID  . pantoprazole  40 mg Oral Daily   . sodium chloride 75 mL/hr at 03/22/13 0022  . heparin 1,750 Units/hr (03/22/13 0040)    OBJECTIVE: Physical Exam: Filed Vitals:   03/22/13 0600 03/22/13 0606 03/22/13 0730 03/22/13 0800  BP: 154/102 158/103  154/89  Pulse: 64 69  67  Temp:   98.2 F (36.8 C)   TempSrc:   Oral   Resp: 14 16  18   Height:      Weight:      SpO2: 98% 97%  97%    Intake/Output Summary (Last 24 hours) at 03/22/13 0851 Last data filed at 03/22/13 0800  Gross per 24 hour  Intake 2519.02 ml  Output   3150 ml  Net -630.98 ml    Telemetry reveals sinus rhythm  GEN- The patient is overweight appearing, alert and oriented x 3 today.   Head- normocephalic, atraumatic Eyes-  Sclera clear, conjunctiva pink Ears- hearing intact Oropharynx- clear Neck- supple,  Lungs- Coarse BS which clear with cough, normal work of breathing Heart- Regular rate and rhythm, no murmurs, rubs or gallops, PMI not laterally displaced GI- soft, NT, ND, + BS Extremities- no clubbing, cyanosis, + edema Skin- no rash or lesion Psych- euthymic mood, full affect Neuro- strength and sensation are intact  LABS: Basic Metabolic Panel:  Recent Labs  14/78/29 0541  NA 138  K 4.4  CL 106  CO2 21  GLUCOSE 104*  BUN 41*  CREATININE 3.87*  CALCIUM 9.9   Liver Function Tests: No results found for this basename: AST, ALT, ALKPHOS, BILITOT, PROT, ALBUMIN,  in the last 72 hours No results found for this basename: LIPASE, AMYLASE,  in the last 72  hours CBC:  Recent Labs  03/22/13 0541  WBC 12.2*  HGB 13.0  HCT 39.3  MCV 99.5  PLT 233   Cardiac Enzymes:  Recent Labs  03/21/13 1218 03/21/13 1920 03/21/13 2345  TROPONINI 6.12* 6.38* 5.79*   BNP: No components found with this basename: POCBNP,  D-Dimer: No results found for this basename: DDIMER,  in the last 72 hours Hemoglobin A1C: No results found for this basename: HGBA1C,  in the last 72 hours Fasting Lipid Panel:  Recent Labs  03/22/13 0541  CHOL 259*  HDL 33*  LDLCALC UNABLE TO CALCULATE IF TRIGLYCERIDE OVER 400 mg/dL  TRIG 562*  CHOLHDL 7.8   Thyroid Function Tests: No results found for this basename: TSH, T4TOTAL, FREET3, T3FREE, THYROIDAB,  in the last 72 hours Anemia Panel: No results found for this basename: VITAMINB12, FOLATE, FERRITIN, TIBC, IRON, RETICCTPCT,  in the last 72 hours  ASSESSMENT AND PLAN:   Assessment and Plan:  1. NSTEMI  The patient presents with chest pain and has elevated troponin concerning for NSTEMI. He has had prior CABG. He is admitted to stepdown for further management. He is on ASA and heparin drip. Continue metoprolol, statin, and imdur as BP allow. Hold  ace inhibitor due to renal failure.  Not presently a candidate for cath due to renal failure. Obtain an echo to evaluate EF.   2. Syncope  He likely had cough syncope. Smoking cessation is advised  Obtain an echo  Follow on telemetry   3. HTN  Above goal  Titrate medicines   4. Renal failure  Creatinine 4.1-->3.8. Will ask nephrology to assist with evaluation and management.  5. OSA  CPAP   6. Tobacco  Cessation advised   7. HL  Increase atorvastatin  Hillis Range, MD 03/22/2013 8:51 AM

## 2013-03-22 NOTE — Progress Notes (Signed)
Echocardiogram 2D Echocardiogram with Definity has been performed.  Dean Estes 03/22/2013, 11:48 AM

## 2013-03-22 NOTE — Progress Notes (Signed)
ANTICOAGULATION CONSULT NOTE - Follow Up Consult  Pharmacy Consult for Heparin  Indication: chest pain/ACS  Allergies  Allergen Reactions  . Codeine Other (See Comments)    REACTION:  "makes me crazy"  . Sulfonamide Derivatives Other (See Comments)    REACTION:  unknown  . Tape Rash    Paper tape OK.   Patient Measurements: Height: 5\' 8"  (172.7 cm) Weight: 248 lb 7.3 oz (112.7 kg) IBW/kg (Calculated) : 68.4 Heparin Dosing Weight: 94 kg  Labs:  Recent Labs  03/21/13 1218 03/21/13 1440 03/21/13 1920 03/21/13 2345  HEPARINUNFRC  --  <0.10*  --  0.24*  TROPONINI 6.12*  --  6.38*  --     Estimated Creatinine Clearance: 47.2 ml/min (by C-G formula based on Cr of 1.85).  Medications:  Heparin 1550 units/hr  Assessment: 67 y/o M on heparin for NSTEMI. HL after rate increase is 0.24. No issues per RN. Other labs as above.   Goal of Therapy:  Heparin level 0.3-0.7 units/ml Monitor platelets by anticoagulation protocol: Yes   Plan:  -Increase heparin drip to 1750 units/hr -0900 HL -Daily CBC/HL -Monitor for bleeding  Thank you for allowing me to take part in this patient's care,  Abran Duke, PharmD Clinical Pharmacist Phone: [phone] Pager: [phone] 03/22/2013 12:51 AM

## 2013-03-23 LAB — SODIUM, URINE, RANDOM: Sodium, Ur: 101 mEq/L

## 2013-03-23 LAB — BASIC METABOLIC PANEL
CO2: 21 mEq/L (ref 19–32)
Chloride: 106 mEq/L (ref 96–112)
Creatinine, Ser: 4.01 mg/dL — ABNORMAL HIGH (ref 0.50–1.35)
GFR calc Af Amer: 16 mL/min — ABNORMAL LOW (ref >90)
GFR calc non Af Amer: 14 mL/min — ABNORMAL LOW (ref >90)
Potassium: 4.4 mEq/L (ref 3.5–5.1)
Sodium: 140 mEq/L (ref 135–145)

## 2013-03-23 LAB — ANA: Anti Nuclear Antibody(ANA): NEGATIVE

## 2013-03-23 LAB — MPO/PR-3 (ANCA) ANTIBODIES
Myeloperoxidase Abs: <1 AU/mL (ref <20)
Serine Protease 3: 1 AU/mL (ref <20)

## 2013-03-23 LAB — HEPARIN LEVEL (UNFRACTIONATED)
Heparin Unfractionated: 0.28 IU/mL — ABNORMAL LOW (ref 0.30–0.70)
Heparin Unfractionated: 0.52 IU/mL (ref 0.30–0.70)

## 2013-03-23 LAB — VITAMIN D 25 HYDROXY (VIT D DEFICIENCY, FRACTURES): Vit D, 25-Hydroxy: 31 ng/mL (ref 30–89)

## 2013-03-23 LAB — GLUCOSE, CAPILLARY
Glucose-Capillary: 96 mg/dL (ref 70–99)
Glucose-Capillary: 111 mg/dL — ABNORMAL HIGH (ref 70–99)
Glucose-Capillary: 106 mg/dL — ABNORMAL HIGH (ref 70–99)
Glucose-Capillary: 118 mg/dL — ABNORMAL HIGH (ref 70–99)

## 2013-03-23 LAB — KAPPA/LAMBDA LIGHT CHAINS
Kappa free light chain: 4.66 mg/dL — ABNORMAL HIGH (ref 0.33–1.94)
Kappa, lambda light chain ratio: 1.61 (ref 0.26–1.65)

## 2013-03-23 LAB — CBC
Hemoglobin: 12.9 g/dL — ABNORMAL LOW (ref 13.0–17.0)
Platelets: 234 10*3/uL (ref 150–400)
RBC: 3.81 MIL/uL — ABNORMAL LOW (ref 4.22–5.81)
WBC: 12.4 10*3/uL — ABNORMAL HIGH (ref 4.0–10.5)

## 2013-03-23 LAB — PARATHYROID HORMONE, INTACT (NO CA): PTH: 190.5 pg/mL — ABNORMAL HIGH (ref 14.0–72.0)

## 2013-03-23 LAB — URINALYSIS, ROUTINE W REFLEX MICROSCOPIC
Bilirubin Urine: NEGATIVE
Glucose, UA: 250 mg/dL — AB
Ketones, ur: NEGATIVE mg/dL
Protein, ur: 100 mg/dL — AB
Urobilinogen, UA: 0.2 mg/dL (ref 0.0–1.0)
pH: 6.5 (ref 5.0–8.0)

## 2013-03-23 LAB — URINE MICROSCOPIC-ADD ON

## 2013-03-23 LAB — PRO B NATRIURETIC PEPTIDE: Pro B Natriuretic peptide (BNP): 3620 pg/mL — ABNORMAL HIGH (ref 0–125)

## 2013-03-23 MED ORDER — HEPARIN BOLUS VIA INFUSION
1500.0000 [IU] | Freq: Once | INTRAVENOUS | Status: AC
  Administered 2013-03-23: 1500 [IU] via INTRAVENOUS
  Filled 2013-03-23: qty 1500

## 2013-03-23 MED ORDER — ISOSORBIDE MONONITRATE ER 30 MG PO TB24
30.0000 mg | ORAL_TABLET | Freq: Every day | ORAL | Status: DC
  Administered 2013-03-23 – 2013-03-24 (×2): 30 mg via ORAL
  Filled 2013-03-23 (×2): qty 1

## 2013-03-23 NOTE — Progress Notes (Signed)
SUBJECTIVE: The patient is doing well today.  At this time, he denies chest pain, shortness of breath, or any new concerns.  Marland Kitchen amLODipine  5 mg Oral Daily  . aspirin EC  81 mg Oral Daily  . atorvastatin  40 mg Oral q1800  . FLUoxetine  20 mg Oral Daily  . metoprolol tartrate  25 mg Oral BID  . pantoprazole  40 mg Oral Daily   . sodium chloride 75 mL (03/22/13 1934)  . heparin 1,750 Units/hr (03/23/13 0739)    OBJECTIVE: Physical Exam: Filed Vitals:   03/23/13 0000 03/23/13 0351 03/23/13 0407 03/23/13 0546  BP: 162/96 157/91    Pulse: 69     Temp:   97.7 F (36.5 C)   TempSrc:   Axillary   Resp: 20 16    Height:      Weight:    244 lb 14.9 oz (111.1 kg)  SpO2: 96% 97% 93%     Intake/Output Summary (Last 24 hours) at 03/23/13 0830 Last data filed at 03/23/13 0600  Gross per 24 hour  Intake   2800 ml  Output   3625 ml  Net   -825 ml    Telemetry reveals sinus rhythm  GEN- The patient is overweight appearing, alert and oriented x 3 today.   Head- normocephalic, atraumatic Eyes-  Sclera clear, conjunctiva pink Ears- hearing intact Oropharynx- clear Neck- supple,  Lungs- Coarse BS which clear with cough, normal work of breathing Heart- Regular rate and rhythm, no murmurs, rubs or gallops, PMI not laterally displaced GI- soft, NT, ND, + BS Extremities- no clubbing, cyanosis, + edema Skin- no rash or lesion Psych- euthymic mood, full affect Neuro- strength and sensation are intact  LABS: Basic Metabolic Panel:  Recent Labs  82/95/62 0541 03/22/13 1332 03/23/13 0543  NA 138  --  140  K 4.4  --  4.4  CL 106  --  106  CO2 21  --  21  GLUCOSE 104*  --  109*  BUN 41*  --  40*  CREATININE 3.87*  --  4.01*  CALCIUM 9.9  --  10.0  MG  --  1.8  --   PHOS  --  3.1  --     Recent Labs  03/22/13 0541 03/23/13 0543  WBC 12.2* 12.4*  HGB 13.0 12.9*  HCT 39.3 37.6*  MCV 99.5 98.7  PLT 233 234   Cardiac Enzymes:  Recent Labs  03/21/13 1920  03/21/13 2345 03/22/13 1332 03/23/13 0543  CKTOTAL  --   --  81  --   TROPONINI 6.38* 5.79*  --  3.17*    Recent Labs  03/22/13 0541  CHOL 259*  HDL 33*  LDLCALC UNABLE TO CALCULATE IF TRIGLYCERIDE OVER 400 mg/dL  TRIG 130*  CHOLHDL 7.8   Anemia Panel:  Recent Labs  03/22/13 1332  VITAMINB12 607  FERRITIN 226  TIBC 219  IRON 91   ECG: SR, RBBB (03/22/13)  Echo: 03/22/13 Study Conclusions  - Left ventricle: The cavity size was normal. Systolic function was normal. The estimated ejection fraction was in the range of 55% to 60%. Although no diagnostic regional wall motion abnormality was identified, this possibility cannot be completely excluded on the basis of this study. Features are consistent with a pseudonormal left ventricular filling pattern, with concomitant abnormal relaxation and increased filling pressure (grade 2 diastolic dysfunction). - Mitral valve: Calcified annulus.   ASSESSMENT AND PLAN:   1. NSTEMI  The patient  presents with chest pain and has elevated troponin concerning for NSTEMI, now downtrending, no acute ECG changes. He has had prior CABG. He is admitted to stepdown for further management. He is on ASA and heparin drip. Continue metoprolol, statin, and imdur as BP allow. Hold ace inhibitor due to renal failure.  Not presently a candidate for cath due to renal failure. Echocardiogram showed  preserved LV EF, no WMA and grade 2 diastolic dysfunction with elevated filling pressures.   2. HTN  Above goal, elevated filling pressures, kidney failure, we will add imdur 30 mg po daily    3. Renal failure - new diagnosis Creatinine 4.1, no improvement with iv fluids (and negative fluid balance), we will increase to 100 cc/hour Will ask nephrology to assist with evaluation and management. This is possibly a consequence of combination of factors including  poorly controlled HTN and chronic excessive use of NSAIDS.  4. OSA  CPAP   5. Tobacco   Cessation advised   7. Hyperlipidemia - on atorvastatin  Tobias Alexander, H, MD 03/23/2013 8:30 AM

## 2013-03-23 NOTE — Progress Notes (Signed)
UR Completed.  Dean Estes [phone] 03/23/2013  

## 2013-03-23 NOTE — Progress Notes (Signed)
[id number] Cardiac Rehab Started MI education with pt. I gave him MI booklet. We discussed smoking cessation. I gave pt tips for quitting and coaching contact number. Pt is upset about his kidney function. We discussed MI and risk factors. Pt states that he is finished with smoking. Please advice [place] if we can ambulate pt.  Beatrix Fetters, RN 03/23/2013 3:33 PM

## 2013-03-23 NOTE — Progress Notes (Signed)
Patient ID: Dean Estes, male   DOB: [birth year], 67 y.o.   MRN: [id number] S:no CP, no new complaints O:BP 157/91  Pulse 69  Temp(Src) 98 F (36.7 C) (Oral)  Resp 16  Ht 5\' 8"  (1.727 m)  Wt 111.1 kg (244 lb 14.9 oz)  BMI 37.25 kg/m2  SpO2 93%  Intake/Output Summary (Last 24 hours) at 03/23/13 1047 Last data filed at 03/23/13 1000  Gross per 24 hour  Intake 2887.27 ml  Output   3625 ml  Net -737.73 ml   Intake/Output: I/O last 3 completed shifts: In: 4325 [P.O.:590; I.V.:3735] Out: 5375 [Urine:5375]  Intake/Output this shift:  Total I/O In: 422.3 [I.V.:422.3] Out: -  Weight change: -1.6 kg (-3 lb 8.4 oz) Gen:WD obese WM in NAD wearing CPAP WUJ:WJXBJ HS, no rub Resp:decreased BS at bases YNW:GNFAO, +BS, nontender Ext:no edema   Recent Labs Lab 03/22/13 0541 03/22/13 1332 03/23/13 0543  NA 138  --  140  K 4.4  --  4.4  CL 106  --  106  CO2 21  --  21  GLUCOSE 104*  --  109*  BUN 41*  --  40*  CREATININE 3.87*  --  4.01*  CALCIUM 9.9  --  10.0  PHOS  --  3.1  --    Liver Function Tests: No results found for this basename: AST, ALT, ALKPHOS, BILITOT, PROT, ALBUMIN,  in the last 168 hours No results found for this basename: LIPASE, AMYLASE,  in the last 168 hours No results found for this basename: AMMONIA,  in the last 168 hours CBC:  Recent Labs Lab 03/22/13 0541 03/23/13 0543  WBC 12.2* 12.4*  HGB 13.0 12.9*  HCT 39.3 37.6*  MCV 99.5 98.7  PLT 233 234   Cardiac Enzymes:  Recent Labs Lab 03/21/13 1218 03/21/13 1920 03/21/13 2345 03/22/13 1332 03/23/13 0543  CKTOTAL  --   --   --  81  --   TROPONINI 6.12* 6.38* 5.79*  --  3.17*   CBG:  Recent Labs Lab 03/22/13 0729 03/22/13 1210 03/22/13 1702 03/22/13 2130 03/23/13 0825  GLUCAP 77 132* 133* 129* 96    Iron Studies:  Recent Labs  03/22/13 1332  IRON 91  TIBC 219  FERRITIN 226   Studies/Results: US Renal  03/22/2013   CLINICAL DATA:  Elevated creatinine  EXAM:  RENAL/URINARY TRACT ULTRASOUND COMPLETE  COMPARISON:  CT abdomen pelvis dated 09/02/2009  FINDINGS: Right Kidney:  Length: 11.3 cm. Two complex interpolar renal cysts measuring 1.8 and 2.0 cm. No hydronephrosis.  Left Kidney:  Length: 11.4 cm. Multiple renal cysts measuring up to 2.2 cm in the lower pole. No hydronephrosis.  Bladder:  Mild bladder wall thickening.  IMPRESSION: No hydronephrosis.  Multiple bilateral renal cysts, including two complex right interpolar cysts measuring up to 2.0 cm. Follow-up MRI abdomen is suggested, preferably with/without contrast when renal function is improved.  Mild bladder wall thickening, correlate for cystitis.   Electronically Signed   By: Charline Bills M.D.   On: 03/22/2013 14:01   . amLODipine  5 mg Oral Daily  . aspirin EC  81 mg Oral Daily  . atorvastatin  40 mg Oral q1800  . FLUoxetine  20 mg Oral Daily  . isosorbide mononitrate  30 mg Oral Daily  . metoprolol tartrate  25 mg Oral BID  . pantoprazole  40 mg Oral Daily    BMET    Component Value Date/Time   NA 140 03/23/2013 0543  K 4.4 03/23/2013 0543   CL 106 03/23/2013 0543   CO2 21 03/23/2013 0543   GLUCOSE 109* 03/23/2013 0543   BUN 40* 03/23/2013 0543   CREATININE 4.01* 03/23/2013 0543   CALCIUM 10.0 03/23/2013 0543   GFRNONAA 14* 03/23/2013 0543   GFRAA 16* 03/23/2013 0543   CBC    Component Value Date/Time   WBC 12.4* 03/23/2013 0543   RBC 3.81* 03/23/2013 0543   HGB 12.9* 03/23/2013 0543   HCT 37.6* 03/23/2013 0543   PLT 234 03/23/2013 0543   MCV 98.7 03/23/2013 0543   MCH 33.9 03/23/2013 0543   MCHC 34.3 03/23/2013 0543   RDW 13.6 03/23/2013 0543   LYMPHSABS 1.7 10/31/2009 0847   MONOABS 0.6 10/31/2009 0847   EOSABS 0.5 10/31/2009 0847   BASOSABS 0.0 10/31/2009 0847     Assessment/Plan:  1. AKI/CKD vs. Progressive CKD.  (last known Scr was 1.65-2.1 in 2011) and per pt has not had any labs in 2 years. (will contact Dr. Alonza Estes office to verify).  Pt with  NSTEMI/ARB/decompensated CHF and NSAID use.  Unclear if this is his baseline Scr as it is possible that from Estes baseline of 2.1 in 2011 and poorly controlled HTN and NSAID use, his Scr has worsened to his present levels.  Will need to obtain more recent Scr from his PCP.  Cont to hold ARB and NSAIDs.  We discussed the ways in which to delay the progression of CKD including: 1. Tight BP control with goal <140/80 2. Use of an ACE or ARB 3. Avoidance of nephrotoxic agents such as NSAID's or Cox-II I's, IV contrast 2. NSTEMI- agree with holding off on cath for now.  He is CP free and without EKG changes 3. HTN- poorly controlled.  Agree with addition of imdur and would consider higher dose of amlodipine or metoprolol  and/or adding hydralazine.  Cont to follow 4. OSA- on cpap 5. Obesity 6. Tobacco abuse.  Dean Estes

## 2013-03-23 NOTE — Progress Notes (Signed)
ANTICOAGULATION CONSULT NOTE - Follow Up Consult  Pharmacy Consult:  Heparin Indication: chest pain/ACS  Allergies  Allergen Reactions  . Codeine Other (See Comments)    REACTION:  "makes me crazy"  . Sulfonamide Derivatives Other (See Comments)    REACTION:  unknown  . Tape Rash    Paper tape OK.    Patient Measurements: Height: 5\' 8"  (172.7 cm) Weight: 244 lb 14.9 oz (111.1 kg) IBW/kg (Calculated) : 68.4 Heparin Dosing Weight: 94 kg  Vital Signs: Temp: 97.7 F (36.5 C) (12/15 0407) Temp src: Axillary (12/15 0407) BP: 157/91 mmHg (12/15 0351) Pulse Rate: 69 (12/15 0000)  Labs:  Recent Labs  03/21/13 1920 03/21/13 2345 03/22/13 0541 03/22/13 0916 03/22/13 1332 03/23/13 0543  HGB  --   --  13.0  --   --  12.9*  HCT  --   --  39.3  --   --  37.6*  PLT  --   --  233  --   --  234  HEPARINUNFRC  --  0.24*  --  0.42  --  0.28*  CREATININE  --   --  3.87*  --   --  4.01*  CKTOTAL  --   --   --   --  81  --   TROPONINI 6.38* 5.79*  --   --   --  3.17*    Estimated Creatinine Clearance: 21.6 ml/min (by C-G formula based on Cr of 4.01).     Assessment: 57 YOM with chest pain/NSTEMI transferred from [facility] to [facility] for further care.  HL slightly low this morning at 0.28 on heparin drip at 1750 units/hr.  No bleeding reported.   Goal of Therapy:  Heparin level 0.3-0.7 units/ml Monitor platelets by anticoagulation protocol: Yes    Plan:  -heparin 1500 unit bolus and increase drip to 1950 units/hr and check a 6 hr HL - Daily HL / CBC Herby Abraham, Pharm.D. [phone] 03/23/2013 8:34 AM

## 2013-03-23 NOTE — Progress Notes (Signed)
ANTICOAGULATION CONSULT NOTE - Follow Up Consult  Pharmacy Consult:  Heparin Indication: chest pain/ACS  Allergies  Allergen Reactions  . Codeine Other (See Comments)    REACTION:  "makes me crazy"  . Sulfonamide Derivatives Other (See Comments)    REACTION:  unknown  . Tape Rash    Paper tape OK.    Labs:  Recent Labs  03/21/13 1920 03/21/13 2345 03/22/13 0541 03/22/13 0916 03/22/13 1332 03/23/13 0543 03/23/13 1500  HGB  --   --  13.0  --   --  12.9*  --   HCT  --   --  39.3  --   --  37.6*  --   PLT  --   --  233  --   --  234  --   HEPARINUNFRC  --  0.24*  --  0.42  --  0.28* 0.52  CREATININE  --   --  3.87*  --   --  4.01*  --   CKTOTAL  --   --   --   --  81  --   --   TROPONINI 6.38* 5.79*  --   --   --  3.17*  --     Estimated Creatinine Clearance: 21.6 ml/min (by C-G formula based on Cr of 4.01).   Assessment: 54 YOM with chest pain/NSTEMI transferred from [facility] to [facility] for further care.   Heparin level now therapeutic  Goal of Therapy:  Heparin level 0.3-0.7 units/ml Monitor platelets by anticoagulation protocol: Yes    Plan:  Continue heparin at 1950 units / hr Follow up AM labs  Thank you. Okey Regal, PharmD [phone]  03/23/2013 4:15 PM

## 2013-03-24 LAB — RENAL FUNCTION PANEL
CO2: 19 mEq/L (ref 19–32)
Calcium: 9.4 mg/dL (ref 8.4–10.5)
GFR calc Af Amer: 17 mL/min — ABNORMAL LOW (ref >90)
GFR calc non Af Amer: 15 mL/min — ABNORMAL LOW (ref >90)
Glucose, Bld: 103 mg/dL — ABNORMAL HIGH (ref 70–99)
Phosphorus: 4.3 mg/dL (ref 2.3–4.6)
Potassium: 4.2 mEq/L (ref 3.5–5.1)
Sodium: 136 mEq/L (ref 135–145)

## 2013-03-24 LAB — PROTEIN ELECTROPHORESIS, SERUM
Alpha-1-Globulin: 5.9 % — ABNORMAL HIGH (ref 2.9–4.9)
Alpha-2-Globulin: 12.7 % — ABNORMAL HIGH (ref 7.1–11.8)
Beta 2: 5.1 % (ref 3.2–6.5)
Gamma Globulin: 16.4 % (ref 11.1–18.8)
M-Spike, %: NOT DETECTED g/dL
Total Protein ELP: 6.6 g/dL (ref 6.0–8.3)

## 2013-03-24 LAB — CBC
Hemoglobin: 11.7 g/dL — ABNORMAL LOW (ref 13.0–17.0)
MCHC: 33.9 g/dL (ref 30.0–36.0)
MCV: 100 fL (ref 78.0–100.0)
WBC: 11.9 10*3/uL — ABNORMAL HIGH (ref 4.0–10.5)

## 2013-03-24 LAB — HEPARIN LEVEL (UNFRACTIONATED): Heparin Unfractionated: 0.48 IU/mL (ref 0.30–0.70)

## 2013-03-24 LAB — UIFE/LIGHT CHAINS/TP QN, 24-HR UR
Albumin, U: DETECTED
Alpha 1, Urine: DETECTED — AB
Alpha 2, Urine: DETECTED — AB
Beta, Urine: DETECTED — AB
Free Lambda Lt Chains,Ur: 3.62 mg/dL — ABNORMAL HIGH (ref 0.02–0.67)
Gamma Globulin, Urine: DETECTED — AB

## 2013-03-24 LAB — C3 COMPLEMENT: C3 Complement: 124 mg/dL (ref 90–180)

## 2013-03-24 LAB — URINE CULTURE

## 2013-03-24 MED ORDER — NITROGLYCERIN 0.4 MG SL SUBL: 0.4000 mg | SUBLINGUAL_TABLET | SUBLINGUAL | Status: AC | PRN

## 2013-03-24 MED ORDER — PRAVASTATIN SODIUM 20 MG PO TABS: 20.0000 mg | ORAL_TABLET | Freq: Every day | ORAL | Status: AC

## 2013-03-24 MED ORDER — ISOSORBIDE MONONITRATE ER 30 MG PO TB24: 30.0000 mg | ORAL_TABLET | Freq: Every day | ORAL | Status: AC

## 2013-03-24 MED ORDER — CEPHALEXIN 500 MG PO CAPS: 500.0000 mg | ORAL_CAPSULE | Freq: Two times a day (BID) | ORAL | Status: DC

## 2013-03-24 NOTE — Discharge Summary (Signed)
CARDIOLOGY DISCHARGE SUMMARY   Patient ID: Dean Estes MRN: [id number] DOB/AGE: [birth year] 67 y.o.  Admit date: 03/21/2013 Discharge date: 03/24/2013  PCP: Ouida Sills Primary Cardiologist: Will be Dr. Wyline Mood in [place]  Primary Discharge Diagnosis:  NSTEMI (non-ST elevated myocardial infarction)  Secondary Discharge Diagnosis:  Hypertension, poorly controlled Acute kidney injury/chronic kidney disease versus progressive CKD OSA on CPAP Obesity Tobacco abuse Renal cysts  Consults: Nephrology  Procedures: 2-D echocardiogram, renal ultrasound  Hospital Course: Dean Estes is a 67 y.o. male with a history of CAD and CABG. He had bypass surgery in 1998. He had not been seen by cardiology since 2009. He had cough syncope the night before admission. He then woke with chest discomfort and described a history of exertional chest pain. He initially went to [facility] where he had ECG changes and elevated troponin. His creatinine was also significantly elevated. He was treated medically he became pain-free. He was transferred to [facility] and admitted for further evaluation and treatment.  His cardiac enzymes remained elevated, indicating a non-STEMI. He was treated medically with aspirin, heparin, nitrates and a beta blocker. Cardiac catheterization was considered but not performed due to the significant renal insufficiency. He remained pain free on medical therapy and is troponin was trending down. He was seen by cardiac rehabilitation and educated on heart-healthy lifestyle changes, MI restrictions and exercise guidelines. He agrees to followup with outpatient cardiac rehabilitation.  A lipid profile is below. He was initially started on Lipitor 40 mg but stated he had problems with Lipitor in the past causing severe joint pains. He will be started on Pravachol at a lower dose and hopefully will tolerate this. It can be increased as an outpatient.  A renal consult was  called because his creatinine was so much higher than previous values which were admittedly 67 years old. He was seen by the nephrology team. A renal ultrasound was performed which showed multiple complex renal cysts. An MRI is recommended in followup but can be obtained as an outpatient. His medications were adjusted and he was hydrated. His creatinine did not improve significantly. A urinalysis and urine culture were performed. He grew out Klebsiella in his urine and will complete antibiotics as an outpatient. Another component to his renal problems could be excessive use of NSAIDS. He will be encouraged to avoid these in the future. He had been on ARB prior to admission and this was discontinued.  His blood pressure was monitored closely during his hospital stay. It improved with the medication adjustments that were made. He is to followup as an outpatient.  02/22/2013, Dean Estes has improved. He was ambulating with cardiac rehabilitation and had no chest pain or shortness of breath. He was tolerating the medication changes well. He was seen by Dr. Abel Presto and by Dr. Delton See. Although we would have preferred to monitor him for another day or so, the patient insisted on discharge do to family issues. He was much improved and it was felt he could be safely discharged home, to follow up as an outpatient. He is to get repeat labs this week and followup with cardiology, internal medicine and nephrology as an outpatient.  Labs:  Lab Results  Component Value Date   WBC 11.9* 03/24/2013   HGB 11.7* 03/24/2013   HCT 34.5* 03/24/2013   MCV 100.0 03/24/2013   PLT 182 03/24/2013     Recent Labs Lab 03/24/13 0425  NA 136  K 4.2  CL 105  CO2  19  BUN 38*  CREATININE 3.88*  CALCIUM 9.4  GLUCOSE 103*    Recent Labs  03/21/13 1920 03/21/13 2345 03/22/13 1332 03/23/13 0543  CKTOTAL  --   --  81  --   TROPONINI 6.38* 5.79*  --  3.17*   Lipid Panel     Component Value Date/Time   CHOL 259*  03/22/2013 0541   TRIG 468* 03/22/2013 0541   HDL 33* 03/22/2013 0541   CHOLHDL 7.8 03/22/2013 0541   VLDL UNABLE TO CALCULATE IF TRIGLYCERIDE OVER 400 mg/dL 16/01/9603 5409   LDLCALC UNABLE TO CALCULATE IF TRIGLYCERIDE OVER 400 mg/dL 81/19/1478 2956    Pro B Natriuretic peptide (BNP)  Date/Time Value Range Status  03/23/2013  5:43 AM 3620.0* 0 - 125 pg/mL Final      Radiology: US Renal 03/22/2013   CLINICAL DATA:  Elevated creatinine  EXAM: RENAL/URINARY TRACT ULTRASOUND COMPLETE  COMPARISON:  CT abdomen pelvis dated 09/02/2009  FINDINGS: Right Kidney:  Length: 11.3 cm. Two complex interpolar renal cysts measuring 1.8 and 2.0 cm. No hydronephrosis.  Left Kidney:  Length: 11.4 cm. Multiple renal cysts measuring up to 2.2 cm in the lower pole. No hydronephrosis.  Bladder:  Mild bladder wall thickening.  IMPRESSION: No hydronephrosis.  Multiple bilateral renal cysts, including two complex right interpolar cysts measuring up to 2.0 cm. Follow-up MRI abdomen is suggested, preferably with/without contrast when renal function is improved.  Mild bladder wall thickening, correlate for cystitis.   Electronically Signed   By: Charline Bills M.D.   On: 03/22/2013 14:01   EKG:  03/23/2013 Sinus rhythm Vent. rate 61 BPM PR interval 202 ms QRS duration 126 ms QT/QTc 426/428 ms P-R-T axes * -3 -76  Echo:  03/22/2013 Conclusions - Left ventricle: The cavity size was normal. Systolic function was normal. The estimated ejection fraction was in the range of 55% to 60%. Although no diagnostic regional wall motion abnormality was identified, this possibility cannot be completely excluded on the basis of this study. Features are consistent with a pseudonormal left ventricular filling pattern, with concomitant abnormal relaxation and increased filling pressure (grade 2 diastolic dysfunction). - Mitral valve: Calcified annulus.   FOLLOW UP PLANS AND APPOINTMENTS Allergies  Allergen Reactions  .  Codeine Other (See Comments)    REACTION:  "makes me crazy"  . Sulfonamide Derivatives Other (See Comments)    REACTION:  unknown  . Tape Rash    Paper tape OK.     Medication List    STOP taking these medications       losartan 100 MG tablet  Commonly known as:  COZAAR      TAKE these medications       ALPRAZolam 1 MG tablet  Commonly known as:  XANAX  Take 1 mg by mouth 2 (two) times daily as needed for anxiety.     amLODipine 5 MG tablet  Commonly known as:  NORVASC  Take 5 mg by mouth daily.     aspirin EC 81 MG tablet  Take 81 mg by mouth daily.     cephALEXin 500 MG capsule  Commonly known as:  KEFLEX  Take 1 capsule (500 mg total) by mouth 2 (two) times daily.     cyclobenzaprine 10 MG tablet  Commonly known as:  FLEXERIL  Take 10 mg by mouth 3 (three) times daily as needed for muscle spasms.     FLUoxetine 20 MG capsule  Commonly known as:  PROZAC  Take 20 mg by  mouth 2 (two) times daily.     HYDROcodone-acetaminophen 5-325 MG per tablet  Commonly known as:  NORCO/VICODIN  Take 1 tablet by mouth 3 (three) times daily as needed for moderate pain.     isosorbide mononitrate 30 MG 24 hr tablet  Commonly known as:  IMDUR  Take 1 tablet (30 mg total) by mouth daily.     Melatonin 5 MG Tabs  Take 5 mg by mouth at bedtime as needed (for sleep).     metoprolol 50 MG tablet  Commonly known as:  LOPRESSOR  Take 25 mg by mouth 2 (two) times daily.     nitroGLYCERIN 0.4 MG SL tablet  Commonly known as:  NITROSTAT  Place 1 tablet (0.4 mg total) under the tongue every 5 (five) minutes x 3 doses as needed for chest pain.     omeprazole 20 MG capsule  Commonly known as:  PRILOSEC  Take 20 mg by mouth daily.     Potassium Gluconate 595 MG Caps  Take 595 mg by mouth daily.     pravastatin 20 MG tablet  Commonly known as:  PRAVACHOL  Take 1 tablet (20 mg total) by mouth daily.     senna 8.6 MG tablet  Commonly known as:  SENOKOT  Take 1 tablet by mouth  daily.     traZODone 50 MG tablet  Commonly known as:  DESYREL  Take 50 mg by mouth at bedtime as needed for sleep.        Discharge Orders   Future Appointments Provider Department Dept Phone   04/07/2013 11:20 AM Antoine Poche, MD [facility] [phone]   Future Orders Complete By Expires   Amb Referral to Cardiac Rehabilitation  As directed    Diet - low sodium heart healthy  As directed    Increase activity slowly  As directed      Follow-up Information   Follow up with Antoine Poche, MD On 04/07/2013. (at 11:20 AM)    Specialty:  Cardiology   Contact information:   [address] [place] [address] [phone]       BRING ALL MEDICATIONS WITH YOU TO FOLLOW UP APPOINTMENTS  Time spent with patient to include physician time: 39 min Signed: Theodore Demark, PA-C 03/24/2013, 4:54 PM Co-Sign MD

## 2013-03-24 NOTE — Progress Notes (Signed)
CARDIAC REHAB PHASE I   PRE:  Rate/Rhythm: 65 SR with PAC's  BP:  Supine: 165/93  Sitting:   Standing:    SaO2:   MODE:  Ambulation: 350 ft   POST:  Rate/Rhythm: 83 SR  BP:  Supine:   Sitting: 152/76  Standing:    SaO2:  2841-3244 Assisted X 1 to ambulate. Gait steady. Pt able to walk 350 feet with one sitting rest stop. Pt c/o of left knee and hip pain. He states that this was ap problem for him prior to coming to the hospital. He was scheduled for steroid injection this Friday for left knee. Pt to recliner after walk with call light in reach. Pt admits that his walking is very limited due to left knee pain.  Melina Copa RN 03/24/2013 9:23 AM

## 2013-03-24 NOTE — Progress Notes (Signed)
Discharged home accompanied by relative , stable,  Discharge instructions given to pt. Belongings with pt.

## 2013-03-24 NOTE — Progress Notes (Signed)
SUBJECTIVE: The patient is doing well today.  At this time, he denies chest pain, shortness of breath, or any new concerns.  Marland Kitchen amLODipine  5 mg Oral Daily  . aspirin EC  81 mg Oral Daily  . atorvastatin  40 mg Oral q1800  . FLUoxetine  20 mg Oral Daily  . isosorbide mononitrate  30 mg Oral Daily  . metoprolol tartrate  25 mg Oral BID  . pantoprazole  40 mg Oral Daily   . sodium chloride 100 mL/hr at 03/24/13 0056  . heparin 1,950 Units/hr (03/23/13 2126)    OBJECTIVE: Physical Exam: Filed Vitals:   03/23/13 2300 03/24/13 0000 03/24/13 0400 03/24/13 0432  BP:  133/74 139/84   Pulse:      Temp:  97.6 F (36.4 C) 97.9 F (36.6 C)   TempSrc:  Oral Oral   Resp:  18 19   Height:      Weight:    248 lb 0.3 oz (112.5 kg)  SpO2: 98%  95%     Intake/Output Summary (Last 24 hours) at 03/24/13 0739 Last data filed at 03/24/13 0700  Gross per 24 hour  Intake 3892.89 ml  Output   2350 ml  Net 1542.89 ml    Telemetry reveals sinus rhythm  GEN- The patient is overweight appearing, alert and oriented x 3 today.   Head- normocephalic, atraumatic Eyes-  Sclera clear, conjunctiva pink Ears- hearing intact Oropharynx- clear Neck- supple,  Lungs- Coarse BS which clear with cough, normal work of breathing Heart- Regular rate and rhythm, no murmurs, rubs or gallops, PMI not laterally displaced GI- soft, NT, ND, + BS Extremities- no clubbing, cyanosis, + edema Skin- no rash or lesion Psych- euthymic mood, full affect Neuro- strength and sensation are intact  LABS: Basic Metabolic Panel:  Recent Labs  16/10/96 1332 03/23/13 0543 03/24/13 0425  NA  --  140 136  K  --  4.4 4.2  CL  --  106 105  CO2  --  21 19  GLUCOSE  --  109* 103*  BUN  --  40* 38*  CREATININE  --  4.01* 3.88*  CALCIUM  --  10.0 9.4  MG 1.8  --   --   PHOS 3.1  --  4.3    Recent Labs  03/23/13 0543 03/24/13 0425  WBC 12.4* 11.9*  HGB 12.9* 11.7*  HCT 37.6* 34.5*  MCV 98.7 100.0  PLT  234 182   Cardiac Enzymes:  Recent Labs  03/21/13 1920 03/21/13 2345 03/22/13 1332 03/23/13 0543  CKTOTAL  --   --  81  --   TROPONINI 6.38* 5.79*  --  3.17*    Recent Labs  03/22/13 0541  CHOL 259*  HDL 33*  LDLCALC UNABLE TO CALCULATE IF TRIGLYCERIDE OVER 400 mg/dL  TRIG 045*  CHOLHDL 7.8   Anemia Panel:  Recent Labs  03/22/13 1332  VITAMINB12 607  FERRITIN 226  TIBC 219  IRON 91   ECG: SR, RBBB (03/22/13)  Echo: 03/22/13 Study Conclusions  - Left ventricle: The cavity size was normal. Systolic function was normal. The estimated ejection fraction was in the range of 55% to 60%. Although no diagnostic regional wall motion abnormality was identified, this possibility cannot be completely excluded on the basis of this study. Features are consistent with a pseudonormal left ventricular filling pattern, with concomitant abnormal relaxation and increased filling pressure (grade 2 diastolic dysfunction). - Mitral valve: Calcified annulus.   ASSESSMENT AND  PLAN:   1. NSTEMI  The patient presents with chest pain and has elevated troponin concerning for NSTEMI, now downtrending, no acute ECG changes. He has had prior CABG. He is admitted to stepdown for further management. He is on ASA and heparin drip. Continue metoprolol, statin, and imdur as BP allow. Hold ace inhibitor due to renal failure.  Not presently a candidate for cath due to renal failure. Echocardiogram showed  preserved LV EF, no WMA and grade 2 diastolic dysfunction with elevated filling pressures.   2. HTN  Above goal, elevated filling pressures, kidney failure, we will add imdur 30 mg po daily    3. Renal failure - new diagnosis, however abnormal crea (2.0) in 2011, so this is possibly slowly progressing disease, unable to figure out is there is an acute component to it, nephrology is following. This is possibly a consequence of combination of factors including  poorly controlled HTN and chronic  excessive use of NSAIDS. Creatinine 4.1, minimal improvement with iv fluids (and negative fluid balance), we will decrease NS to 50 cc/hour, to avoid fluid overload   4. OSA  CPAP   5. Tobacco  Cessation advised   7. Hyperlipidemia - on atorvastatin   Dean Estes, Rexene Edison, MD 03/24/2013 7:39 AM

## 2013-03-24 NOTE — Progress Notes (Addendum)
Patient ID: Dean Estes, male   DOB: June 28, 1945, 67 y.o.   MRN: [id number] S:feels better and wants to go home.  No recurrent CP O:BP 172/116  Pulse 78  Temp(Src) 98 F (36.7 C) (Oral)  Resp 18  Ht 5\' 8"  (1.727 m)  Wt 112.5 kg (248 lb 0.3 oz)  BMI 37.72 kg/m2  SpO2 96%  Intake/Output Summary (Last 24 hours) at 03/24/13 1036 Last data filed at 03/24/13 0900  Gross per 24 hour  Intake 3578.5 ml  Output   3200 ml  Net  378.5 ml   Intake/Output: I/O last 3 completed shifts: In: 5179.3 [P.O.:1230; I.V.:3949.3] Out: 5175 [Urine:5175]  Intake/Output this shift:  Total I/O In: 439 [P.O.:200; I.V.:239] Out: 450 [Urine:450] Weight change: 1.4 kg (3 lb 1.4 oz) Gen:WD obese WM in NAD CVS:no rub, faint HS Resp:decreased BS at bases WUJ:[id number],  Ext:no edema   Recent Labs Lab 03/22/13 0541 03/22/13 1332 03/23/13 0543 03/24/13 0425  NA 138  --  140 136  K 4.4  --  4.4 4.2  CL 106  --  106 105  CO2 21  --  21 19  GLUCOSE 104*  --  109* 103*  BUN 41*  --  40* 38*  CREATININE 3.87*  --  4.01* 3.88*  ALBUMIN  --   --   --  3.1*  CALCIUM 9.9  --  10.0 9.4  PHOS  --  3.1  --  4.3   Liver Function Tests:  Recent Labs Lab 03/24/13 0425  ALBUMIN 3.1*   No results found for this basename: LIPASE, AMYLASE,  in the last 168 hours No results found for this basename: AMMONIA,  in the last 168 hours CBC:  Recent Labs Lab 03/22/13 0541 03/23/13 0543 03/24/13 0425  WBC 12.2* 12.4* 11.9*  HGB 13.0 12.9* 11.7*  HCT 39.3 37.6* 34.5*  MCV 99.5 98.7 100.0  PLT 233 234 182   Cardiac Enzymes:  Recent Labs Lab 03/21/13 1218 03/21/13 1920 03/21/13 2345 03/22/13 1332 03/23/13 0543  CKTOTAL  --   --   --  81  --   TROPONINI 6.12* 6.38* 5.79*  --  3.17*   CBG:  Recent Labs Lab 03/23/13 0825 03/23/13 1229 03/23/13 1628 03/23/13 2148 03/24/13 0753  GLUCAP 96 111* 106* 118* 106*    Iron Studies:  Recent Labs  03/22/13 1332  IRON 91  TIBC 219  FERRITIN 226    Studies/Results: US Renal  03/22/2013   CLINICAL DATA:  Elevated creatinine  EXAM: RENAL/URINARY TRACT ULTRASOUND COMPLETE  COMPARISON:  CT abdomen pelvis dated 09/02/2009  FINDINGS: Right Kidney:  Length: 11.3 cm. Two complex interpolar renal cysts measuring 1.8 and 2.0 cm. No hydronephrosis.  Left Kidney:  Length: 11.4 cm. Multiple renal cysts measuring up to 2.2 cm in the lower pole. No hydronephrosis.  Bladder:  Mild bladder wall thickening.  IMPRESSION: No hydronephrosis.  Multiple bilateral renal cysts, including two complex right interpolar cysts measuring up to 2.0 cm. Follow-up MRI abdomen is suggested, preferably with/without contrast when renal function is improved.  Mild bladder wall thickening, correlate for cystitis.   Electronically Signed   By: Charline Bills M.D.   On: 03/22/2013 14:01   . amLODipine  5 mg Oral Daily  . aspirin EC  81 mg Oral Daily  . atorvastatin  40 mg Oral q1800  . FLUoxetine  20 mg Oral Daily  . isosorbide mononitrate  30 mg Oral Daily  . metoprolol tartrate  25 mg  Oral BID  . pantoprazole  40 mg Oral Daily    BMET    Component Value Date/Time   NA 136 03/24/2013 0425   K 4.2 03/24/2013 0425   CL 105 03/24/2013 0425   CO2 19 03/24/2013 0425   GLUCOSE 103* 03/24/2013 0425   BUN 38* 03/24/2013 0425   CREATININE 3.88* 03/24/2013 0425   CALCIUM 9.4 03/24/2013 0425   GFRNONAA 15* 03/24/2013 0425   GFRAA 17* 03/24/2013 0425   CBC    Component Value Date/Time   WBC 11.9* 03/24/2013 0425   RBC 3.45* 03/24/2013 0425   HGB 11.7* 03/24/2013 0425   HCT 34.5* 03/24/2013 0425   PLT 182 03/24/2013 0425   MCV 100.0 03/24/2013 0425   MCH 33.9 03/24/2013 0425   MCHC 33.9 03/24/2013 0425   RDW 13.8 03/24/2013 0425   LYMPHSABS 1.7 10/31/2009 0847   MONOABS 0.6 10/31/2009 0847   EOSABS 0.5 10/31/2009 0847   BASOSABS 0.0 10/31/2009 0847     Assessment/Plan:  1. AKI/CKD vs. Progressive CKD. (last known Scr was 1.65-2.1 in 2011) and per pt has not had  any labs in 2 years. (I contacted Dr. Alonza Smoker office yesterday and they do not have any Scr levels on file there). Pt with NSTEMI/ARB/decompensated CHF and NSAID use. Unclear if this is his baseline Scr (due to progressive CKD related to poorly controlled HTN and NSAID use).  Cont to hold ARB and NSAIDs. We discussed the ways in which to delay the progression of CKD including:  1. Tight BP control with goal <140/80 2. Use of an ACE or ARB 3. Avoidance of nephrotoxic agents such as NSAID's or Cox-II I's, IV contrast 2. NSTEMI- agree with holding off on cath for now. He is CP free and without EKG changes 3. HTN- poorly controlled. Agree with addition of imdur and would consider higher dose of amlodipine to 10mg  and/or metoprolol and/or adding hydralazine.  4. OSA- on cpap 5. Obesity 6. Tobacco abuse. 7. Dispo- per cardiology.  If he is to go home, he will need to f/u with his PCP Dr. Ouida Sills within 1 weeks for labs to follow his CKD and will set up outpt follow up with our office or he can be seen locally with Dr. Sundra Aland.   8. Renal cysts- per Radiology will need further w/u with abd MRI without contrast and possible urology evaluation as an outpt  Keavon Sensing A

## 2013-03-24 NOTE — Progress Notes (Signed)
Cardiac Rehab [id number] Completed MI education with pt. He voices understanding. He agrees to [employer]. CRP in [place], will send referral. Beatrix Fetters, RN 03/24/2013 3:41 PM

## 2013-03-24 NOTE — Discharge Summary (Signed)
Dean Estes, Dean Estes 03/24/2013

## 2013-03-24 NOTE — Progress Notes (Signed)
ANTICOAGULATION CONSULT NOTE - Follow Up Consult  Pharmacy Consult:  Heparin Indication: chest pain/ACS  Allergies  Allergen Reactions  . Codeine Other (See Comments)    REACTION:  "makes me crazy"  . Sulfonamide Derivatives Other (See Comments)    REACTION:  unknown  . Tape Rash    Paper tape OK.    Labs:  Recent Labs  03/21/13 1920 03/21/13 2345  03/22/13 0541  03/22/13 1332 03/23/13 0543 03/23/13 1500 03/24/13 0425  HGB  --   --   < > 13.0  --   --  12.9*  --  11.7*  HCT  --   --   --  39.3  --   --  37.6*  --  34.5*  PLT  --   --   --  233  --   --  234  --  182  HEPARINUNFRC  --  0.24*  --   --   < >  --  0.28* 0.52 0.48  CREATININE  --   --   --  3.87*  --   --  4.01*  --  3.88*  CKTOTAL  --   --   --   --   --  81  --   --   --   TROPONINI 6.38* 5.79*  --   --   --   --  3.17*  --   --   < > = values in this interval not displayed.  Estimated Creatinine Clearance: 22.5 ml/min (by C-G formula based on Cr of 3.88).   Assessment: 11 YOM admitted 03/21/2013  with chest pain/NSTEMI transferred from [facility] to [facility] for further care.  Pharmacy consulted to dose heparin.  Coag: ACS, Heparin level now therapeutic, heparin at 1950 units/hr, H/H slight trend down.  CV: HTN, CAD, hyperlipidemia ASA, atorvastatin, IMDUR, metoprolol HR 70s, SBP 130-170s  Neph: CrCl ~22 ml/min  Goal of Therapy:  Heparin level 0.3-0.7 units/ml Monitor platelets by anticoagulation protocol: Yes   Plan:  Continue heparin at 1950 units / hr Follow up AM labs  Thank you for allowing pharmacy to be a part of this patients care team.  Lovenia Kim Pharm.D., BCPS Clinical Pharmacist 03/24/2013 1:21 PM Pager: [phone] Phone: [phone]

## 2013-03-25 LAB — UIFE/LIGHT CHAINS/TP QN, 24-HR UR
Albumin, U: DETECTED
Free Kappa/Lambda Ratio: 5.83 ratio (ref 2.04–10.37)
Free Lambda Lt Chains,Ur: 2.83 mg/dL — ABNORMAL HIGH (ref 0.02–0.67)
Total Protein, Urine: 64.4 mg/dL

## 2013-03-25 LAB — PROTEIN ELECTROPHORESIS, SERUM
Albumin ELP: 56.3 % (ref 55.8–66.1)
Alpha-1-Globulin: 6 % — ABNORMAL HIGH (ref 2.9–4.9)
Alpha-2-Globulin: 12.6 % — ABNORMAL HIGH (ref 7.1–11.8)
Beta Globulin: 5.9 % (ref 4.7–7.2)
Gamma Globulin: 14.6 % (ref 11.1–18.8)

## 2013-04-07 ENCOUNTER — Encounter: Payer: Self-pay | Admitting: Cardiology

## 2013-04-07 ENCOUNTER — Ambulatory Visit: Payer: Medicare Other | Admitting: Cardiology

## 2013-04-07 ENCOUNTER — Ambulatory Visit (INDEPENDENT_AMBULATORY_CARE_PROVIDER_SITE_OTHER): Payer: Medicare Other | Admitting: Cardiology

## 2013-04-07 VITALS — BP 142/86 | HR 94 | Temp 98.2°F | Ht 67.0 in | Wt 238.0 lb

## 2013-04-07 DIAGNOSIS — I2510 Atherosclerotic heart disease of native coronary artery without angina pectoris: Secondary | ICD-10-CM

## 2013-04-07 DIAGNOSIS — I10 Essential (primary) hypertension: Secondary | ICD-10-CM

## 2013-04-07 DIAGNOSIS — E785 Hyperlipidemia, unspecified: Secondary | ICD-10-CM

## 2013-04-07 DIAGNOSIS — N185 Chronic kidney disease, stage 5: Secondary | ICD-10-CM

## 2013-04-07 DIAGNOSIS — R7309 Other abnormal glucose: Secondary | ICD-10-CM

## 2013-04-07 MED ORDER — AMLODIPINE BESYLATE 10 MG PO TABS: 10.0000 mg | ORAL_TABLET | Freq: Every day | ORAL | Status: AC

## 2013-04-07 NOTE — Progress Notes (Signed)
Clinical Summary Dean Estes is a 67 y.o.male seen today as a hospital follow up appointment.   1. NSTEMI - history of prior CABG in 1998 - recent admit 12/13-12/16/14 with chest pain, found to have elevated cardiac enzymes with a peak troponin around 6. Managed medically for NSTEMI due to significant renal dysfunction, no cath was performed.  - 03/2013 Echo LVEF 55-60%  - since discharge denies any recent chest pain. Does report recent URI with some SOB cough that is improving.  - compliant with meds  2. HTN - checks bp 2 times a week. Typically 140s/80s.  - compliant with medications  3. CKD - admission Cr 3.8, unclear if AKI on CKD or progression of CKD, his last Cr was 1.85 3 years ago.  - seen by nephrology during recent admission, renal US showed multiple cysts. Also noted to have significant NSAID use.  -discharge Cr 3.88, GFR 15.  - patient reports he has not followed up with nephrology since discharge, he is not aware of any appointments.   4. Hyperlipidemia - joint pain on atorvastatin before, was started on pravastatin at recent discharge. Denies any current side effects - 03/22/13 TC 259 TG 468 HDL 33 LDL cannot calculate. Unclear if this was a true fasting panel.    Past Medical History  Diagnosis Date  . CAD (coronary artery disease)     s/p prior CABG  . Obesity   . Obstructive sleep apnea     uses CPAP  . Hypertension   . DDD (degenerative disc disease)   . DJD (degenerative joint disease)   . Chronic renal failure   . Depression   . Tobacco abuse      Allergies  Allergen Reactions  . Codeine Other (See Comments)    REACTION:  "makes me crazy"  . Sulfonamide Derivatives Other (See Comments)    REACTION:  unknown  . Tape Rash    Paper tape OK.     Current Outpatient Prescriptions  Medication Sig Dispense Refill  . ALPRAZolam (XANAX) 1 MG tablet Take 1 mg by mouth 2 (two) times daily as needed for anxiety.      Marland Kitchen amLODipine (NORVASC) 5 MG  tablet Take 5 mg by mouth daily.      Marland Kitchen aspirin EC 81 MG tablet Take 81 mg by mouth daily.      . cephALEXin (KEFLEX) 500 MG capsule Take 1 capsule (500 mg total) by mouth 2 (two) times daily.  10 capsule  0  . cyclobenzaprine (FLEXERIL) 10 MG tablet Take 10 mg by mouth 3 (three) times daily as needed for muscle spasms.      Marland Kitchen FLUoxetine (PROZAC) 20 MG capsule Take 20 mg by mouth 2 (two) times daily.      Marland Kitchen HYDROcodone-acetaminophen (NORCO/VICODIN) 5-325 MG per tablet Take 1 tablet by mouth 3 (three) times daily as needed for moderate pain.      . isosorbide mononitrate (IMDUR) 30 MG 24 hr tablet Take 1 tablet (30 mg total) by mouth daily.  30 tablet  11  . Melatonin 5 MG TABS Take 5 mg by mouth at bedtime as needed (for sleep).      . metoprolol (LOPRESSOR) 50 MG tablet Take 25 mg by mouth 2 (two) times daily.      . nitroGLYCERIN (NITROSTAT) 0.4 MG SL tablet Place 1 tablet (0.4 mg total) under the tongue every 5 (five) minutes x 3 doses as needed for chest pain.  25 tablet  12  . omeprazole (PRILOSEC) 20 MG capsule Take 20 mg by mouth daily.      . Potassium Gluconate 595 MG CAPS Take 595 mg by mouth daily.      . pravastatin (PRAVACHOL) 20 MG tablet Take 1 tablet (20 mg total) by mouth daily.  30 tablet  11  . senna (SENOKOT) 8.6 MG tablet Take 1 tablet by mouth daily.      . traZODone (DESYREL) 50 MG tablet Take 50 mg by mouth at bedtime as needed for sleep.       No current facility-administered medications for this visit.     Past Surgical History  Procedure Laterality Date  . Back surgery      x 3  . Coronary artery bypass graft      17 years ago     Allergies  Allergen Reactions  . Codeine Other (See Comments)    REACTION:  "makes me crazy"  . Sulfonamide Derivatives Other (See Comments)    REACTION:  unknown  . Tape Rash    Paper tape OK.      Family History  Problem Relation Age of Onset  . Hypertension       Social History Dean Estes reports that he has  been smoking.  He does not have any smokeless tobacco history on file. Dean Estes reports that he drinks alcohol.   Review of Systems CONSTITUTIONAL: No weight loss, fever, chills, weakness or fatigue.  HEENT: Eyes: nasal congestion SKIN: No rash or itching.  CARDIOVASCULAR: per HPI RESPIRATORY: cough, SOB  GASTROINTESTINAL: No anorexia, nausea, vomiting or diarrhea. No abdominal pain or blood.  GENITOURINARY: No burning on urination, no polyuria NEUROLOGICAL: No headache, dizziness, syncope, paralysis, ataxia, numbness or tingling in the extremities. No change in bowel or bladder control.  MUSCULOSKELETAL: No muscle, back pain, joint pain or stiffness.  LYMPHATICS: No enlarged nodes. No history of splenectomy.  PSYCHIATRIC: No history of depression or anxiety.  ENDOCRINOLOGIC: No reports of sweating, cold or heat intolerance. No polyuria or polydipsia.  Marland Kitchen   Physical Examination p 94 142/86 Wt 238 BMI 37 Gen: resting comfortably, no acute distress HEENT: no scleral icterus, pupils equal round and reactive, no palptable cervical adenopathy,  CV: RRR, no m/r/g, no JVD, no carotid bruits Resp: Clear to auscultation bilaterally GI: abdomen is soft, non-tender, non-distended, normal bowel sounds, no hepatosplenomegaly MSK: extremities are warm, no edema.  Skin: warm, no rash Neuro:  no focal deficits Psych: appropriate affect   Diagnostic Studies 03/22/13 Echo LVEF 55-60%, grade II diastolic dysfunction,     Assessment and Plan  1. CAD - recent admission with NSTEMI, medically managed due to severe renal dysfunction - no recent symptoms, continue medical therapy  2. HTN - not at goal given his renal dysfunction, goal BP is <130/80. Will increase norvasc to 10mg  daily  3. CKD - unclear if function at admission is his baseline or if he had an AKI component - repeat BMET, will refer to neprhology for outpatient follow up  4. Hyperlipidemia - previously did not tolerate  atorvastatin, tolerating low dose prava without issues - hospital lipid panel showed very high TGs, unclear if true fasting panel. Will repeat, will also check HgbA1c in setting of CAD and very high TGs. Continue pravastatin for now.    Follow up 3 months   Antoine Poche, M.D., F.A.C.C.

## 2013-04-07 NOTE — Patient Instructions (Addendum)
Your physician recommends that you schedule a follow-up appointment in: 3 months with Dr. Wyline Mood. This appointment will be scheduled today before you leave.  Your physician has recommended you make the following change in your medication:  Increase Amlodipine (Norvasc) 10 MG once daily  Continue all other medications the same.   Your physician recommends that you return for lab work in: tomorrow for Fasting Lipid, Hemoglobin A1C and BMET. Nothing to eat or drink after midnight.  You can go to the following locations to get lab work done: [facility] [address] Dr. Lysbeth Galas office in [place] [facility].   Your physician has referred you to a Nephrologist.

## 2013-04-28 ENCOUNTER — Telehealth: Payer: Self-pay | Admitting: Cardiology

## 2013-04-28 NOTE — Telephone Encounter (Signed)
LM for pt to return call. Calling to see if pt has had lab work completed.

## 2013-04-30 ENCOUNTER — Encounter: Payer: Self-pay | Admitting: Cardiology

## 2013-05-01 ENCOUNTER — Encounter: Payer: Self-pay | Admitting: Cardiology

## 2013-05-01 NOTE — Telephone Encounter (Signed)
Mailed pt a letter to remind of requested lab work from last office visit. Letter had lab orders enclosed as well.

## 2013-06-02 ENCOUNTER — Telehealth: Payer: Self-pay | Admitting: Cardiology

## 2013-06-02 NOTE — Telephone Encounter (Signed)
LM for pt to call office.

## 2013-06-02 NOTE — Telephone Encounter (Signed)
Message copied by Burnice LoganATES, Trenten Watchman M on Tue Jun 02, 2013 11:13 AM ------      Message from: Burnice LoganATES, Paddy Walthall M      Created: Wed Apr 08, 2013  4:08 PM      Regarding: Dr. Wyline MoodBranch FU       Fasting lipid      A1c      BMET       ------

## 2013-06-08 ENCOUNTER — Encounter: Payer: Self-pay | Admitting: Cardiology

## 2013-06-08 NOTE — Telephone Encounter (Signed)
Mailed letter to pt reminding pt of lab work.

## 2013-06-19 ENCOUNTER — Telehealth: Payer: Self-pay | Admitting: Cardiology

## 2013-06-19 NOTE — Telephone Encounter (Signed)
I have attempted to contact pt multiple times. I have left several messages and mailed several letters. I have received all three letters back with return to sender and a forwarding address to [place]. Attempted to call pt again today to verify address no answer; I eft message for pt to return call.

## 2013-07-03 ENCOUNTER — Ambulatory Visit: Payer: Medicare Other | Admitting: Cardiology

## 2013-07-03 NOTE — Progress Notes (Unsigned)
Clinical Summary [name] is a 68 y.o.male seen today for follow up of the following medical problems.   1. NSTEMI  - history of prior CABG in 1998  - recent admit 12/13-12/16/14 with chest pain, found to have elevated cardiac enzymes with a peak troponin around 6. Managed medically for NSTEMI due to significant renal dysfunction, no cath was performed.  - 03/2013 Echo LVEF 55-60%  - since discharge denies any recent chest pain. Does report recent URI with some SOB cough that is improving.  - compliant with meds   2. HTN  - checks bp 2 times a week. Typically 140s/80s.  - compliant with medications   3. CKD  - admission Cr 3.8, unclear if AKI on CKD or progression of CKD, his last Cr was 1.85 3 years ago.  - seen by nephrology during recent admission, renal US showed multiple cysts. Also noted to have significant NSAID use.  -discharge Cr 3.88, GFR 15.  - patient reports he has not followed up with nephrology since discharge, he is not aware of any appointments.  - last visit ordered BMET but he has not had it drawn yet  4. Hyperlipidemia  - joint pain on atorvastatin before, was started on pravastatin at recent discharge. Denies any current side effects  - 03/22/13 TC 259 TG 468 HDL 33 LDL cannot calculate. Unclear if this was a true fasting panel.  - last visit reorered lipid panel but he has not had it drawn yet Past Medical History  Diagnosis Date  . CAD (coronary artery disease)     s/p prior CABG  . Obesity   . Obstructive sleep apnea     uses CPAP  . Hypertension   . DDD (degenerative disc disease)   . DJD (degenerative joint disease)   . Chronic renal failure   . Depression   . Tobacco abuse      Allergies  Allergen Reactions  . Codeine Other (See Comments)    REACTION:  "makes me crazy"  . Sulfonamide Derivatives Other (See Comments)    REACTION:  unknown  . Tape Rash    Paper tape OK.     Current Outpatient Prescriptions  Medication Sig  Dispense Refill  . ALPRAZolam (XANAX) 1 MG tablet Take 1 mg by mouth 2 (two) times daily as needed for anxiety.      Marland Kitchen. amLODipine (NORVASC) 10 MG tablet Take 1 tablet (10 mg total) by mouth daily.  30 tablet  6  . aspirin EC 81 MG tablet Take 81 mg by mouth daily.      . cyclobenzaprine (FLEXERIL) 10 MG tablet Take 10 mg by mouth 3 (three) times daily as needed for muscle spasms.      Marland Kitchen. FLUoxetine (PROZAC) 20 MG capsule Take 20 mg by mouth 2 (two) times daily.      Marland Kitchen. HYDROcodone-acetaminophen (NORCO/VICODIN) 5-325 MG per tablet Take 1 tablet by mouth 3 (three) times daily as needed for moderate pain.      . isosorbide mononitrate (IMDUR) 30 MG 24 hr tablet Take 1 tablet (30 mg total) by mouth daily.  30 tablet  11  . Melatonin 5 MG TABS Take 5 mg by mouth at bedtime as needed (for sleep).      . metoprolol (LOPRESSOR) 50 MG tablet Take 25 mg by mouth 2 (two) times daily.      . nitroGLYCERIN (NITROSTAT) 0.4 MG SL tablet Place 1 tablet (0.4 mg total) under the tongue every  5 (five) minutes x 3 doses as needed for chest pain.  25 tablet  12  . omeprazole (PRILOSEC) 20 MG capsule Take 20 mg by mouth daily.      . Potassium Gluconate 595 MG CAPS Take 595 mg by mouth daily.      . pravastatin (PRAVACHOL) 20 MG tablet Take 1 tablet (20 mg total) by mouth daily.  30 tablet  11  . senna (SENOKOT) 8.6 MG tablet Take 1 tablet by mouth daily.      . traZODone (DESYREL) 50 MG tablet Take 50 mg by mouth at bedtime as needed for sleep.       No current facility-administered medications for this visit.     Past Surgical History  Procedure Laterality Date  . Back surgery      x 3  . Coronary artery bypass graft      17 years ago     Allergies  Allergen Reactions  . Codeine Other (See Comments)    REACTION:  "makes me crazy"  . Sulfonamide Derivatives Other (See Comments)    REACTION:  unknown  . Tape Rash    Paper tape OK.      Family History  Problem Relation Age of Onset  . Hypertension        Social History Dean Estes reports that he has been smoking Cigarettes.  He started smoking about 52 years ago. He has a 50 pack-year smoking history. He has never used smokeless tobacco. Dean Estes reports that he drinks alcohol.   Review of Systems CONSTITUTIONAL: No weight loss, fever, chills, weakness or fatigue.  HEENT: Eyes: No visual loss, blurred vision, double vision or yellow sclerae.No hearing loss, sneezing, congestion, runny nose or sore throat.  SKIN: No rash or itching.  CARDIOVASCULAR:  RESPIRATORY: No shortness of breath, cough or sputum.  GASTROINTESTINAL: No anorexia, nausea, vomiting or diarrhea. No abdominal pain or blood.  GENITOURINARY: No burning on urination, no polyuria NEUROLOGICAL: No headache, dizziness, syncope, paralysis, ataxia, numbness or tingling in the extremities. No change in bowel or bladder control.  MUSCULOSKELETAL: No muscle, back pain, joint pain or stiffness.  LYMPHATICS: No enlarged nodes. No history of splenectomy.  PSYCHIATRIC: No history of depression or anxiety.  ENDOCRINOLOGIC: No reports of sweating, cold or heat intolerance. No polyuria or polydipsia.  Marland Kitchen   Physical Examination There were no vitals filed for this visit. There were no vitals filed for this visit.  Gen: resting comfortably, no acute distress HEENT: no scleral icterus, pupils equal round and reactive, no palptable cervical adenopathy,  CV Resp: Clear to auscultation bilaterally GI: abdomen is soft, non-tender, non-distended, normal bowel sounds, no hepatosplenomegaly MSK: extremities are warm, no edema.  Skin: warm, no rash Neuro:  no focal deficits Psych: appropriate affect   Diagnostic Studies 03/22/13 Echo  LVEF 55-60%, grade II diastolic dysfunction   Assessment and Plan   1. CAD  - recent admission with NSTEMI, medically managed due to severe renal dysfunction  - no recent symptoms, continue medical therapy   2. HTN  - not at goal given  his renal dysfunction, goal BP is <130/80. Will increase norvasc to 10mg  daily   3. CKD  - unclear if function at admission is his baseline or if he had an AKI component  - repeat BMET, will refer to neprhology for outpatient follow up   4. Hyperlipidemia  - previously did not tolerate atorvastatin, tolerating low dose prava without issues  - hospital lipid panel showed very  high TGs, unclear if true fasting panel. Will repeat, will also check HgbA1c in setting of CAD and very high TGs. Continue pravastatin for now.       Antoine Poche, M.D., F.A.C.C.

## 2015-04-26 IMAGING — US US RENAL
1 series · 14 of 25 positions shown · non-contrast
Comparison: CT abdomen pelvis dated 09/02/2009

CLINICAL DATA: Elevated creatinine

EXAM:
RENAL/URINARY TRACT ULTRASOUND COMPLETE

[Series 1: us renal · 0.25mm/px · 14 of 56 slices shown]
[im 1/56]
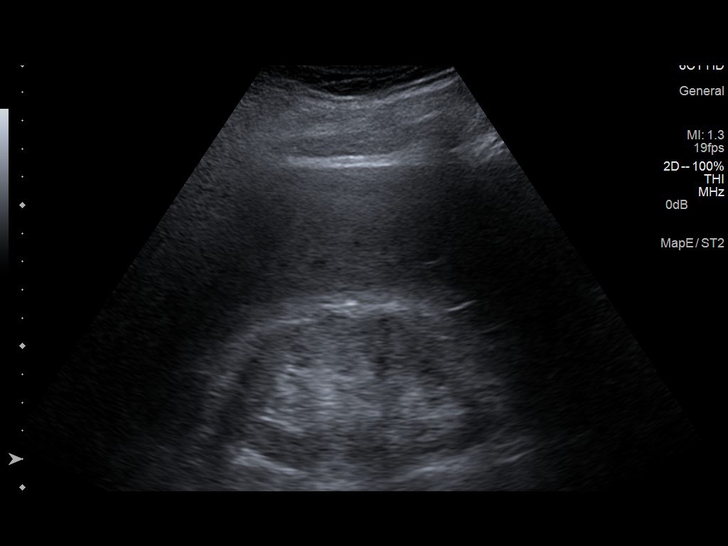
[im 5/56]
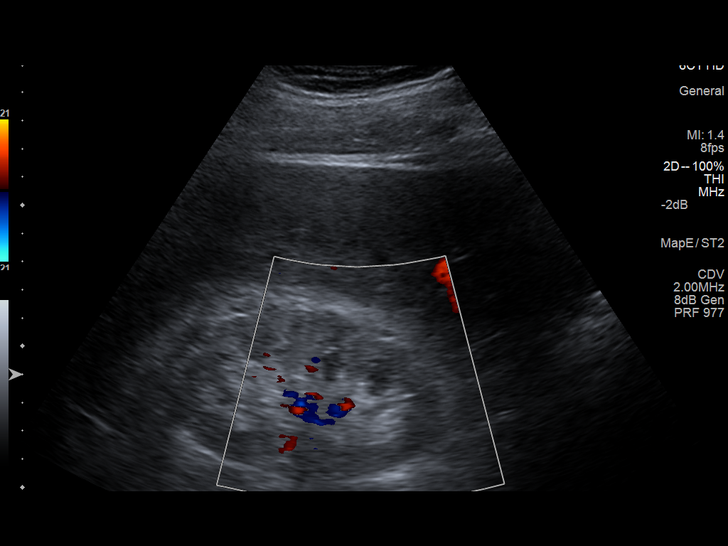
[im 10/56]
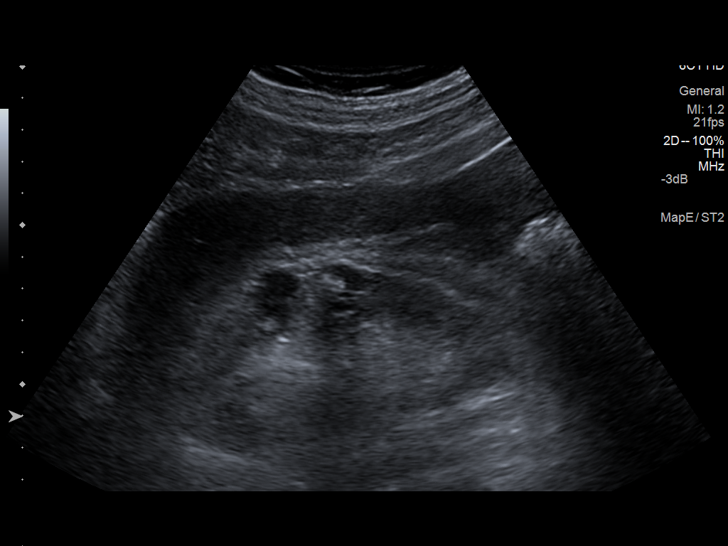
[im 14/56]
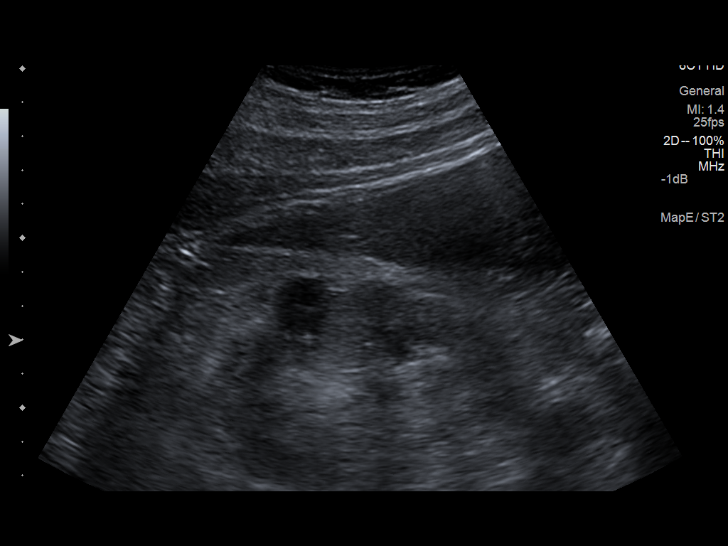
[im 19/56]
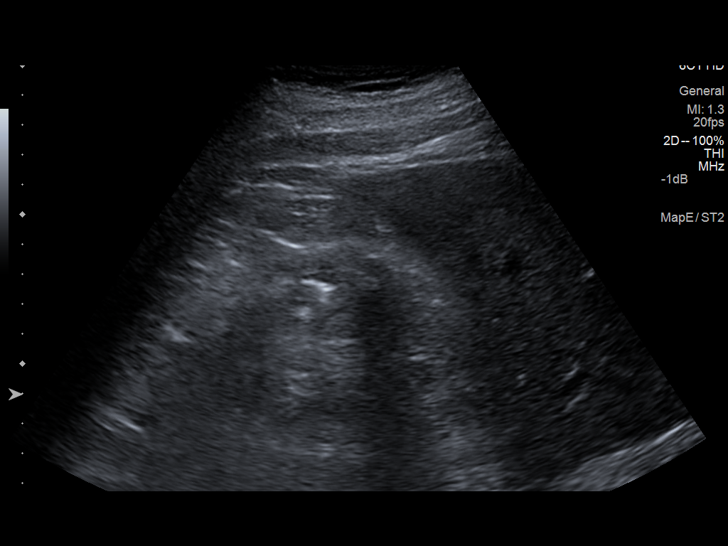
[im 21/56]
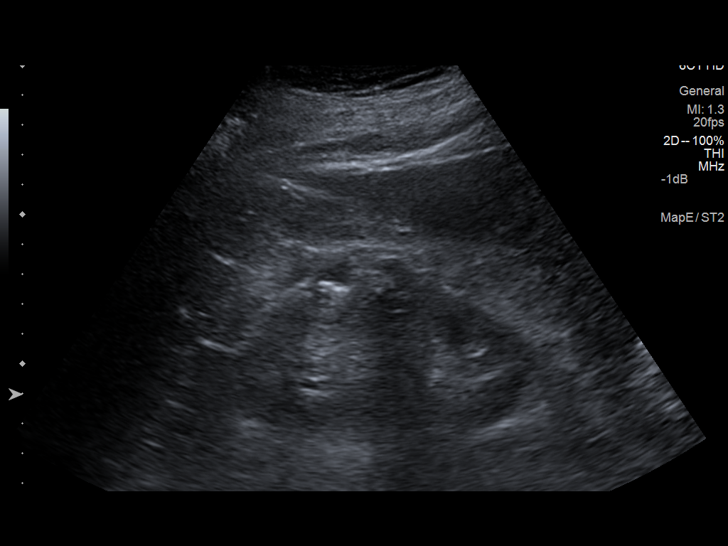
[im 26/56]
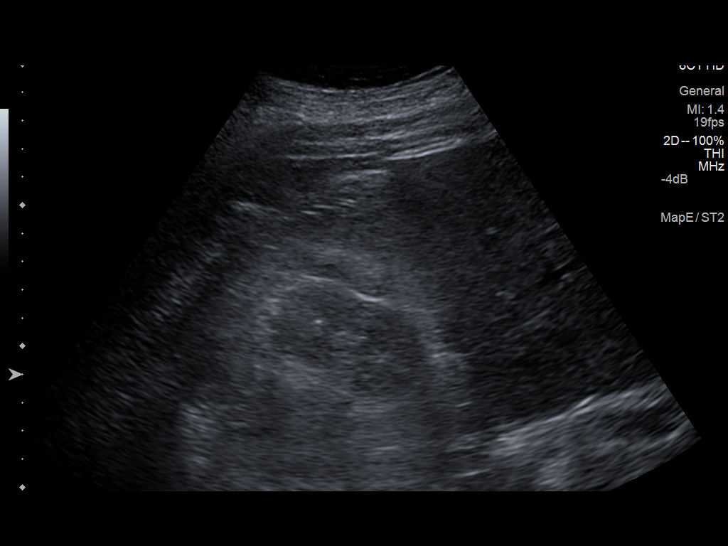
[im 30/56]
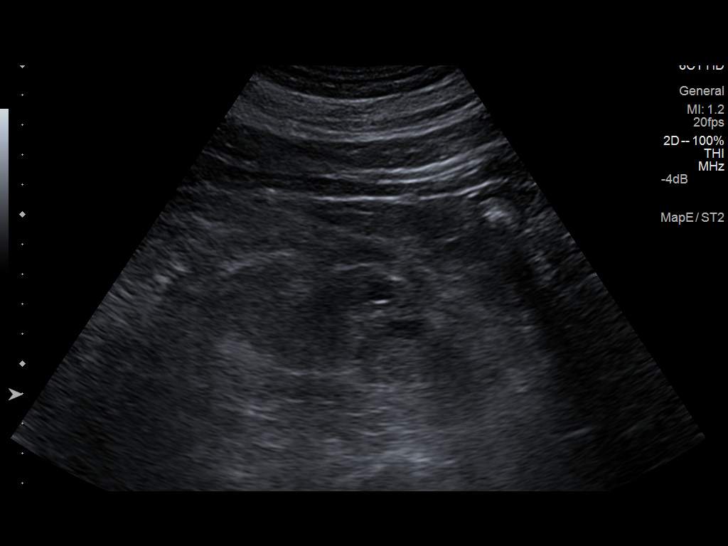
[im 35/56]
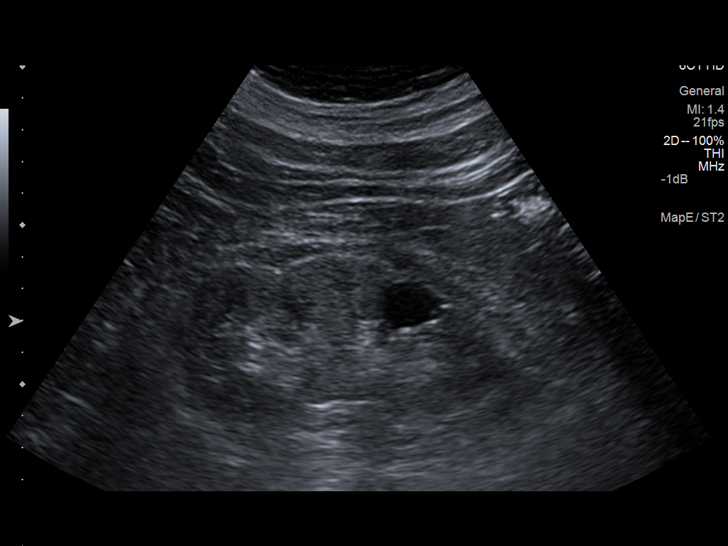
[im 37/56]
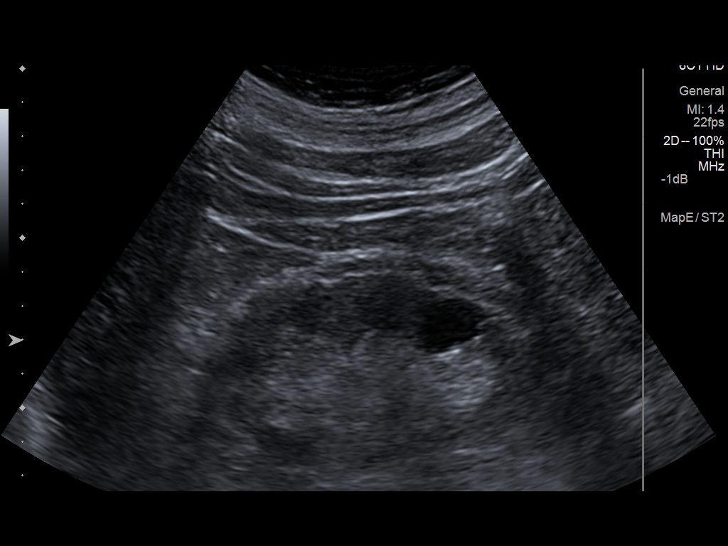
[im 42/56]
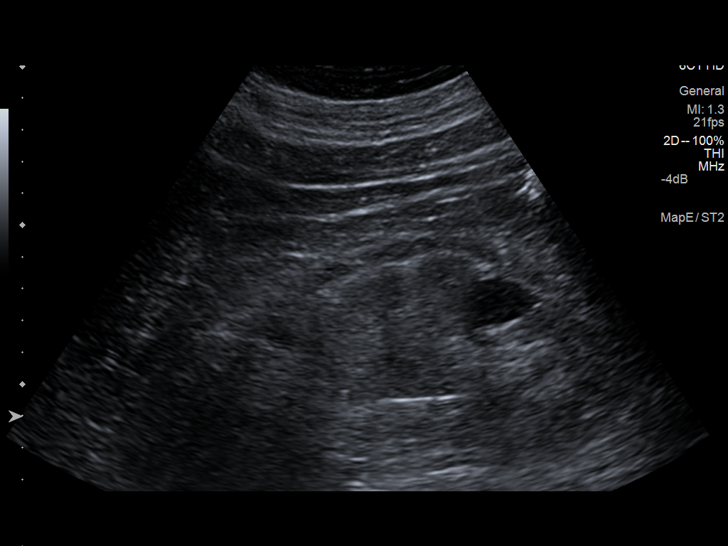
[im 46/56]
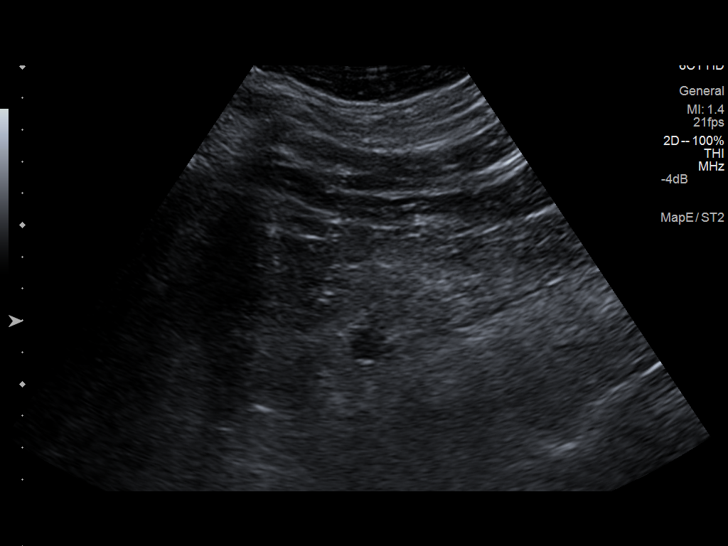
[im 51/56]
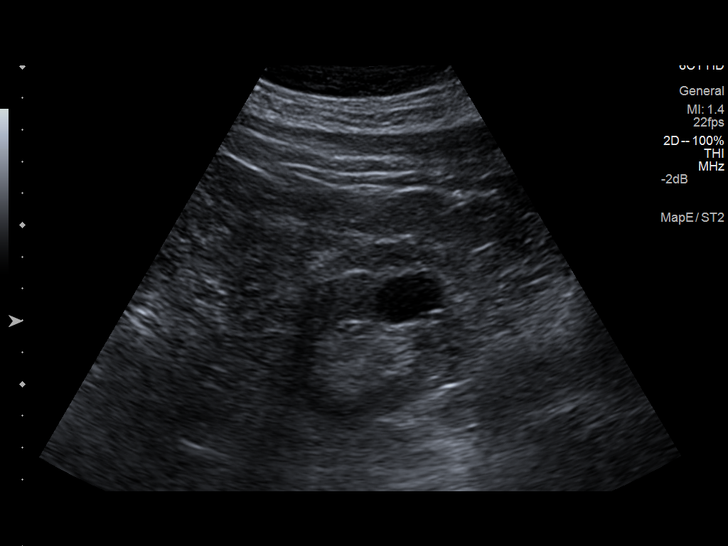
[im 56/56]
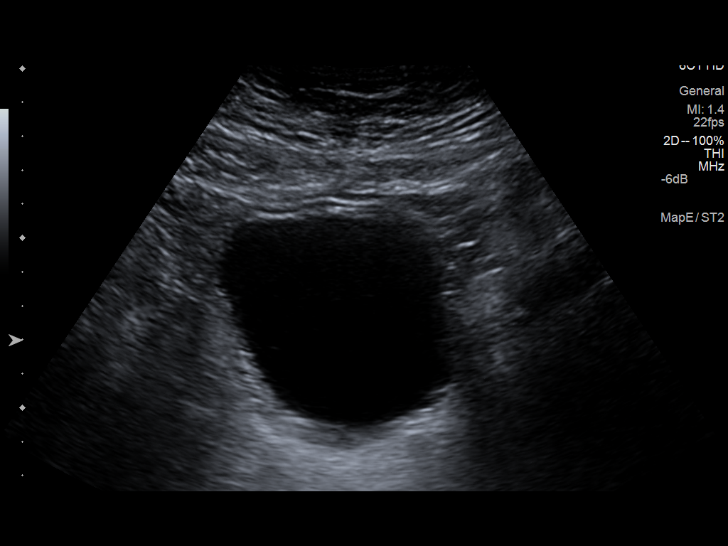

[14 of 25 positions shown; findings below may reference images not displayed]

FINDINGS: Right Kidney:

Length: 11.3 cm. Two complex interpolar renal cysts measuring
and 2.0 cm. No hydronephrosis.

Left Kidney:

Length: 11.4 cm. Multiple renal cysts measuring up to 2.2 cm in the
lower pole. No hydronephrosis.

Bladder:

Mild bladder wall thickening.
IMPRESSION: No hydronephrosis.

Multiple bilateral renal cysts, including two complex right
interpolar cysts measuring up to 2.0 cm. Follow-up MRI abdomen is
suggested, preferably with/without contrast when renal function is
improved.

Mild bladder wall thickening, correlate for cystitis.
